# Patient Record
Sex: Female | Born: 1965
Health system: Southern US, Community
[De-identification: ages and names within clinical notes are randomized; demographics above are authoritative.]

---

## 2001-11-11 ENCOUNTER — Encounter: Payer: Self-pay | Admitting: *Deleted

## 2001-11-11 ENCOUNTER — Encounter: Admission: RE | Admit: 2001-11-11 | Discharge: 2001-11-11 | Payer: Self-pay | Admitting: *Deleted

## 2001-12-05 ENCOUNTER — Encounter: Payer: Self-pay | Admitting: Family Medicine

## 2001-12-05 ENCOUNTER — Encounter: Admission: RE | Admit: 2001-12-05 | Discharge: 2001-12-05 | Payer: Self-pay | Admitting: Family Medicine

## 2001-12-14 ENCOUNTER — Encounter: Payer: Self-pay | Admitting: General Surgery

## 2001-12-14 ENCOUNTER — Observation Stay (HOSPITAL_COMMUNITY): Admission: RE | Admit: 2001-12-14 | Discharge: 2001-12-15 | Payer: Self-pay | Admitting: General Surgery

## 2004-05-24 ENCOUNTER — Other Ambulatory Visit: Admission: RE | Admit: 2004-05-24 | Discharge: 2004-05-24 | Payer: Self-pay | Admitting: Family Medicine

## 2004-12-28 ENCOUNTER — Encounter: Admission: RE | Admit: 2004-12-28 | Discharge: 2004-12-28 | Payer: Self-pay | Admitting: Family Medicine

## 2005-07-13 ENCOUNTER — Other Ambulatory Visit: Admission: RE | Admit: 2005-07-13 | Discharge: 2005-07-13 | Payer: Self-pay | Admitting: Family Medicine

## 2010-09-05 ENCOUNTER — Other Ambulatory Visit: Payer: Self-pay | Admitting: Obstetrics and Gynecology

## 2010-09-05 ENCOUNTER — Other Ambulatory Visit (HOSPITAL_COMMUNITY)
Admission: RE | Admit: 2010-09-05 | Discharge: 2010-09-05 | Disposition: A | Discharge status: Home / Self Care | Payer: PRIVATE HEALTH INSURANCE | Source: Ambulatory Visit | Attending: Obstetrics and Gynecology | Admitting: Obstetrics and Gynecology

## 2010-09-05 DIAGNOSIS — Z01419 Encounter for gynecological examination (general) (routine) without abnormal findings: Secondary | ICD-10-CM | POA: Insufficient documentation

## 2011-02-21 ENCOUNTER — Other Ambulatory Visit: Payer: Self-pay | Admitting: Physician Assistant

## 2011-02-21 ENCOUNTER — Ambulatory Visit
Admission: RE | Admit: 2011-02-21 | Discharge: 2011-02-21 | Disposition: A | Discharge status: Home / Self Care | Payer: PRIVATE HEALTH INSURANCE | Source: Ambulatory Visit | Attending: Physician Assistant | Admitting: Physician Assistant

## 2011-02-21 DIAGNOSIS — M549 Dorsalgia, unspecified: Secondary | ICD-10-CM

## 2011-09-05 ENCOUNTER — Other Ambulatory Visit (HOSPITAL_COMMUNITY)
Admission: RE | Admit: 2011-09-05 | Discharge: 2011-09-05 | Disposition: A | Discharge status: Home / Self Care | Payer: PRIVATE HEALTH INSURANCE | Source: Ambulatory Visit | Attending: Obstetrics and Gynecology | Admitting: Obstetrics and Gynecology

## 2011-09-05 ENCOUNTER — Other Ambulatory Visit: Payer: Self-pay | Admitting: Obstetrics and Gynecology

## 2011-09-05 DIAGNOSIS — Z01419 Encounter for gynecological examination (general) (routine) without abnormal findings: Secondary | ICD-10-CM | POA: Insufficient documentation

## 2012-09-04 ENCOUNTER — Other Ambulatory Visit: Payer: Self-pay | Admitting: Obstetrics and Gynecology

## 2012-09-04 ENCOUNTER — Other Ambulatory Visit (HOSPITAL_COMMUNITY)
Admission: RE | Admit: 2012-09-04 | Discharge: 2012-09-04 | Disposition: A | Discharge status: Home / Self Care | Payer: BC Managed Care – PPO | Source: Ambulatory Visit | Attending: Obstetrics and Gynecology | Admitting: Obstetrics and Gynecology

## 2012-09-04 DIAGNOSIS — Z01419 Encounter for gynecological examination (general) (routine) without abnormal findings: Secondary | ICD-10-CM | POA: Insufficient documentation

## 2012-09-04 DIAGNOSIS — Z1151 Encounter for screening for human papillomavirus (HPV): Secondary | ICD-10-CM | POA: Insufficient documentation

## 2013-09-08 ENCOUNTER — Other Ambulatory Visit: Payer: Self-pay | Admitting: Obstetrics and Gynecology

## 2013-09-08 ENCOUNTER — Other Ambulatory Visit (HOSPITAL_COMMUNITY)
Admission: RE | Admit: 2013-09-08 | Discharge: 2013-09-08 | Disposition: A | Discharge status: Home / Self Care | Payer: BC Managed Care – PPO | Source: Ambulatory Visit | Attending: Obstetrics and Gynecology | Admitting: Obstetrics and Gynecology

## 2013-09-08 DIAGNOSIS — Z01419 Encounter for gynecological examination (general) (routine) without abnormal findings: Secondary | ICD-10-CM | POA: Insufficient documentation

## 2014-09-07 ENCOUNTER — Other Ambulatory Visit: Payer: Self-pay | Admitting: Obstetrics and Gynecology

## 2014-09-07 ENCOUNTER — Other Ambulatory Visit (HOSPITAL_COMMUNITY)
Admission: RE | Admit: 2014-09-07 | Discharge: 2014-09-07 | Disposition: A | Discharge status: Home / Self Care | Payer: BLUE CROSS/BLUE SHIELD | Source: Ambulatory Visit | Attending: Obstetrics and Gynecology | Admitting: Obstetrics and Gynecology

## 2014-09-07 DIAGNOSIS — Z01419 Encounter for gynecological examination (general) (routine) without abnormal findings: Secondary | ICD-10-CM | POA: Insufficient documentation

## 2014-09-08 LAB — CYTOLOGY - PAP

## 2015-06-22 ENCOUNTER — Other Ambulatory Visit: Payer: Self-pay | Admitting: Gastroenterology

## 2015-09-07 ENCOUNTER — Other Ambulatory Visit: Payer: Self-pay | Admitting: Obstetrics and Gynecology

## 2015-09-07 ENCOUNTER — Other Ambulatory Visit (HOSPITAL_COMMUNITY)
Admission: RE | Admit: 2015-09-07 | Discharge: 2015-09-07 | Disposition: A | Discharge status: Home / Self Care | Payer: BLUE CROSS/BLUE SHIELD | Source: Ambulatory Visit | Attending: Obstetrics and Gynecology | Admitting: Obstetrics and Gynecology

## 2015-09-07 DIAGNOSIS — Z1151 Encounter for screening for human papillomavirus (HPV): Secondary | ICD-10-CM | POA: Insufficient documentation

## 2015-09-07 DIAGNOSIS — Z01419 Encounter for gynecological examination (general) (routine) without abnormal findings: Secondary | ICD-10-CM | POA: Insufficient documentation

## 2015-09-09 LAB — CYTOLOGY - PAP

## 2015-10-21 DIAGNOSIS — F339 Major depressive disorder, recurrent, unspecified: Secondary | ICD-10-CM | POA: Diagnosis not present

## 2015-10-21 DIAGNOSIS — G479 Sleep disorder, unspecified: Secondary | ICD-10-CM | POA: Diagnosis not present

## 2016-03-07 DIAGNOSIS — D225 Melanocytic nevi of trunk: Secondary | ICD-10-CM | POA: Diagnosis not present

## 2016-03-07 DIAGNOSIS — D1800 Hemangioma unspecified site: Secondary | ICD-10-CM | POA: Diagnosis not present

## 2016-03-07 DIAGNOSIS — Z808 Family history of malignant neoplasm of other organs or systems: Secondary | ICD-10-CM | POA: Diagnosis not present

## 2016-03-07 DIAGNOSIS — L814 Other melanin hyperpigmentation: Secondary | ICD-10-CM | POA: Diagnosis not present

## 2016-04-28 DIAGNOSIS — G479 Sleep disorder, unspecified: Secondary | ICD-10-CM | POA: Diagnosis not present

## 2016-04-28 DIAGNOSIS — F339 Major depressive disorder, recurrent, unspecified: Secondary | ICD-10-CM | POA: Diagnosis not present

## 2016-09-28 DIAGNOSIS — G4733 Obstructive sleep apnea (adult) (pediatric): Secondary | ICD-10-CM | POA: Diagnosis not present

## 2016-10-24 DIAGNOSIS — Z01419 Encounter for gynecological examination (general) (routine) without abnormal findings: Secondary | ICD-10-CM | POA: Diagnosis not present

## 2016-11-15 DIAGNOSIS — M9903 Segmental and somatic dysfunction of lumbar region: Secondary | ICD-10-CM | POA: Diagnosis not present

## 2016-11-15 DIAGNOSIS — M5442 Lumbago with sciatica, left side: Secondary | ICD-10-CM | POA: Diagnosis not present

## 2016-11-15 DIAGNOSIS — M5106 Intervertebral disc disorders with myelopathy, lumbar region: Secondary | ICD-10-CM | POA: Diagnosis not present

## 2016-11-15 DIAGNOSIS — Z0000 Encounter for general adult medical examination without abnormal findings: Secondary | ICD-10-CM | POA: Diagnosis not present

## 2016-11-15 DIAGNOSIS — F339 Major depressive disorder, recurrent, unspecified: Secondary | ICD-10-CM | POA: Diagnosis not present

## 2016-11-15 DIAGNOSIS — M545 Low back pain: Secondary | ICD-10-CM | POA: Diagnosis not present

## 2016-11-15 DIAGNOSIS — G479 Sleep disorder, unspecified: Secondary | ICD-10-CM | POA: Diagnosis not present

## 2016-11-20 DIAGNOSIS — M5442 Lumbago with sciatica, left side: Secondary | ICD-10-CM | POA: Diagnosis not present

## 2016-11-20 DIAGNOSIS — M9903 Segmental and somatic dysfunction of lumbar region: Secondary | ICD-10-CM | POA: Diagnosis not present

## 2016-11-20 DIAGNOSIS — M5106 Intervertebral disc disorders with myelopathy, lumbar region: Secondary | ICD-10-CM | POA: Diagnosis not present

## 2016-11-29 DIAGNOSIS — M5442 Lumbago with sciatica, left side: Secondary | ICD-10-CM | POA: Diagnosis not present

## 2016-11-29 DIAGNOSIS — M5106 Intervertebral disc disorders with myelopathy, lumbar region: Secondary | ICD-10-CM | POA: Diagnosis not present

## 2016-11-29 DIAGNOSIS — M9903 Segmental and somatic dysfunction of lumbar region: Secondary | ICD-10-CM | POA: Diagnosis not present

## 2016-12-26 DIAGNOSIS — H01022 Squamous blepharitis right lower eyelid: Secondary | ICD-10-CM | POA: Diagnosis not present

## 2016-12-26 DIAGNOSIS — H01024 Squamous blepharitis left upper eyelid: Secondary | ICD-10-CM | POA: Diagnosis not present

## 2016-12-26 DIAGNOSIS — H01021 Squamous blepharitis right upper eyelid: Secondary | ICD-10-CM | POA: Diagnosis not present

## 2016-12-26 DIAGNOSIS — H01025 Squamous blepharitis left lower eyelid: Secondary | ICD-10-CM | POA: Diagnosis not present

## 2017-03-07 DIAGNOSIS — Z808 Family history of malignant neoplasm of other organs or systems: Secondary | ICD-10-CM | POA: Diagnosis not present

## 2017-03-07 DIAGNOSIS — D225 Melanocytic nevi of trunk: Secondary | ICD-10-CM | POA: Diagnosis not present

## 2017-03-07 DIAGNOSIS — L814 Other melanin hyperpigmentation: Secondary | ICD-10-CM | POA: Diagnosis not present

## 2017-03-07 DIAGNOSIS — D1800 Hemangioma unspecified site: Secondary | ICD-10-CM | POA: Diagnosis not present

## 2017-03-08 DIAGNOSIS — N906 Unspecified hypertrophy of vulva: Secondary | ICD-10-CM | POA: Diagnosis not present

## 2017-03-08 DIAGNOSIS — N898 Other specified noninflammatory disorders of vagina: Secondary | ICD-10-CM | POA: Diagnosis not present

## 2017-03-08 DIAGNOSIS — N907 Vulvar cyst: Secondary | ICD-10-CM | POA: Diagnosis not present

## 2017-05-18 DIAGNOSIS — G479 Sleep disorder, unspecified: Secondary | ICD-10-CM | POA: Diagnosis not present

## 2017-05-18 DIAGNOSIS — F339 Major depressive disorder, recurrent, unspecified: Secondary | ICD-10-CM | POA: Diagnosis not present

## 2017-07-26 ENCOUNTER — Other Ambulatory Visit: Payer: Self-pay | Admitting: Chiropractor

## 2017-07-26 DIAGNOSIS — M5442 Lumbago with sciatica, left side: Secondary | ICD-10-CM

## 2017-07-30 ENCOUNTER — Ambulatory Visit
Admission: RE | Admit: 2017-07-30 | Discharge: 2017-07-30 | Disposition: A | Discharge status: Home / Self Care | Payer: BLUE CROSS/BLUE SHIELD | Source: Ambulatory Visit | Attending: Chiropractor | Admitting: Chiropractor

## 2017-07-30 DIAGNOSIS — M48061 Spinal stenosis, lumbar region without neurogenic claudication: Secondary | ICD-10-CM | POA: Diagnosis not present

## 2017-07-30 DIAGNOSIS — M5442 Lumbago with sciatica, left side: Secondary | ICD-10-CM

## 2017-08-07 DIAGNOSIS — M545 Low back pain: Secondary | ICD-10-CM | POA: Diagnosis not present

## 2017-08-08 DIAGNOSIS — M5126 Other intervertebral disc displacement, lumbar region: Secondary | ICD-10-CM | POA: Diagnosis not present

## 2017-08-08 DIAGNOSIS — M5416 Radiculopathy, lumbar region: Secondary | ICD-10-CM | POA: Diagnosis not present

## 2017-08-13 DIAGNOSIS — M5416 Radiculopathy, lumbar region: Secondary | ICD-10-CM | POA: Diagnosis not present

## 2017-08-13 DIAGNOSIS — M5126 Other intervertebral disc displacement, lumbar region: Secondary | ICD-10-CM | POA: Diagnosis not present

## 2017-08-16 DIAGNOSIS — M5416 Radiculopathy, lumbar region: Secondary | ICD-10-CM | POA: Diagnosis not present

## 2017-08-16 DIAGNOSIS — M5126 Other intervertebral disc displacement, lumbar region: Secondary | ICD-10-CM | POA: Diagnosis not present

## 2017-08-21 DIAGNOSIS — M5416 Radiculopathy, lumbar region: Secondary | ICD-10-CM | POA: Diagnosis not present

## 2017-08-21 DIAGNOSIS — M5126 Other intervertebral disc displacement, lumbar region: Secondary | ICD-10-CM | POA: Diagnosis not present

## 2017-08-23 DIAGNOSIS — M5416 Radiculopathy, lumbar region: Secondary | ICD-10-CM | POA: Diagnosis not present

## 2017-08-23 DIAGNOSIS — M5126 Other intervertebral disc displacement, lumbar region: Secondary | ICD-10-CM | POA: Diagnosis not present

## 2017-08-28 DIAGNOSIS — M5416 Radiculopathy, lumbar region: Secondary | ICD-10-CM | POA: Diagnosis not present

## 2017-08-28 DIAGNOSIS — M5126 Other intervertebral disc displacement, lumbar region: Secondary | ICD-10-CM | POA: Diagnosis not present

## 2017-08-29 DIAGNOSIS — M545 Low back pain: Secondary | ICD-10-CM | POA: Diagnosis not present

## 2017-09-03 DIAGNOSIS — M5416 Radiculopathy, lumbar region: Secondary | ICD-10-CM | POA: Diagnosis not present

## 2017-09-11 DIAGNOSIS — M5416 Radiculopathy, lumbar region: Secondary | ICD-10-CM | POA: Diagnosis not present

## 2017-09-21 DIAGNOSIS — M5416 Radiculopathy, lumbar region: Secondary | ICD-10-CM | POA: Diagnosis not present

## 2017-09-28 ENCOUNTER — Other Ambulatory Visit: Payer: Self-pay | Admitting: Physical Medicine and Rehabilitation

## 2017-09-28 DIAGNOSIS — M545 Low back pain: Principal | ICD-10-CM

## 2017-09-28 DIAGNOSIS — G8929 Other chronic pain: Secondary | ICD-10-CM

## 2017-10-08 ENCOUNTER — Ambulatory Visit
Admission: RE | Admit: 2017-10-08 | Discharge: 2017-10-08 | Disposition: A | Discharge status: Home / Self Care | Payer: BLUE CROSS/BLUE SHIELD | Source: Ambulatory Visit | Attending: Physical Medicine and Rehabilitation | Admitting: Physical Medicine and Rehabilitation

## 2017-10-08 DIAGNOSIS — G8929 Other chronic pain: Secondary | ICD-10-CM

## 2017-10-08 DIAGNOSIS — M545 Low back pain: Principal | ICD-10-CM

## 2017-10-08 DIAGNOSIS — M4727 Other spondylosis with radiculopathy, lumbosacral region: Secondary | ICD-10-CM | POA: Diagnosis not present

## 2017-10-08 MED ORDER — METHYLPREDNISOLONE ACETATE 40 MG/ML INJ SUSP (RADIOLOG
120.0000 mg | Freq: Once | INTRAMUSCULAR | Status: AC
  Administered 2017-10-08: 120 mg via EPIDURAL

## 2017-10-08 MED ORDER — IOPAMIDOL (ISOVUE-M 200) INJECTION 41%
1.0000 mL | Freq: Once | INTRAMUSCULAR | Status: AC
  Administered 2017-10-08: 1 mL via EPIDURAL

## 2017-10-08 NOTE — Discharge Instructions (Signed)

## 2017-10-26 DIAGNOSIS — Z01419 Encounter for gynecological examination (general) (routine) without abnormal findings: Secondary | ICD-10-CM | POA: Diagnosis not present

## 2017-10-30 DIAGNOSIS — M5416 Radiculopathy, lumbar region: Secondary | ICD-10-CM | POA: Diagnosis not present

## 2017-11-07 DIAGNOSIS — M5416 Radiculopathy, lumbar region: Secondary | ICD-10-CM | POA: Diagnosis not present

## 2017-11-07 DIAGNOSIS — M5126 Other intervertebral disc displacement, lumbar region: Secondary | ICD-10-CM | POA: Diagnosis not present

## 2017-11-13 DIAGNOSIS — M5126 Other intervertebral disc displacement, lumbar region: Secondary | ICD-10-CM | POA: Diagnosis not present

## 2017-11-13 DIAGNOSIS — M5416 Radiculopathy, lumbar region: Secondary | ICD-10-CM | POA: Diagnosis not present

## 2017-11-15 DIAGNOSIS — M5416 Radiculopathy, lumbar region: Secondary | ICD-10-CM | POA: Diagnosis not present

## 2017-11-15 DIAGNOSIS — M5126 Other intervertebral disc displacement, lumbar region: Secondary | ICD-10-CM | POA: Diagnosis not present

## 2017-11-19 DIAGNOSIS — G479 Sleep disorder, unspecified: Secondary | ICD-10-CM | POA: Diagnosis not present

## 2017-11-19 DIAGNOSIS — Z0000 Encounter for general adult medical examination without abnormal findings: Secondary | ICD-10-CM | POA: Diagnosis not present

## 2017-11-19 DIAGNOSIS — Z13220 Encounter for screening for lipoid disorders: Secondary | ICD-10-CM | POA: Diagnosis not present

## 2017-11-19 DIAGNOSIS — F339 Major depressive disorder, recurrent, unspecified: Secondary | ICD-10-CM | POA: Diagnosis not present

## 2017-11-21 DIAGNOSIS — M5126 Other intervertebral disc displacement, lumbar region: Secondary | ICD-10-CM | POA: Diagnosis not present

## 2017-11-21 DIAGNOSIS — M5416 Radiculopathy, lumbar region: Secondary | ICD-10-CM | POA: Diagnosis not present

## 2017-11-28 DIAGNOSIS — M5416 Radiculopathy, lumbar region: Secondary | ICD-10-CM | POA: Diagnosis not present

## 2017-11-28 DIAGNOSIS — M5126 Other intervertebral disc displacement, lumbar region: Secondary | ICD-10-CM | POA: Diagnosis not present

## 2017-12-12 DIAGNOSIS — M5126 Other intervertebral disc displacement, lumbar region: Secondary | ICD-10-CM | POA: Diagnosis not present

## 2017-12-12 DIAGNOSIS — M5416 Radiculopathy, lumbar region: Secondary | ICD-10-CM | POA: Diagnosis not present

## 2018-01-09 DIAGNOSIS — M5126 Other intervertebral disc displacement, lumbar region: Secondary | ICD-10-CM | POA: Diagnosis not present

## 2018-01-09 DIAGNOSIS — M5416 Radiculopathy, lumbar region: Secondary | ICD-10-CM | POA: Diagnosis not present

## 2018-01-25 DIAGNOSIS — H2513 Age-related nuclear cataract, bilateral: Secondary | ICD-10-CM | POA: Diagnosis not present

## 2018-03-12 DIAGNOSIS — D1800 Hemangioma unspecified site: Secondary | ICD-10-CM | POA: Diagnosis not present

## 2018-03-12 DIAGNOSIS — Z808 Family history of malignant neoplasm of other organs or systems: Secondary | ICD-10-CM | POA: Diagnosis not present

## 2018-03-12 DIAGNOSIS — D225 Melanocytic nevi of trunk: Secondary | ICD-10-CM | POA: Diagnosis not present

## 2018-03-12 DIAGNOSIS — L814 Other melanin hyperpigmentation: Secondary | ICD-10-CM | POA: Diagnosis not present

## 2018-04-24 DIAGNOSIS — Z1231 Encounter for screening mammogram for malignant neoplasm of breast: Secondary | ICD-10-CM | POA: Diagnosis not present

## 2018-05-21 DIAGNOSIS — G479 Sleep disorder, unspecified: Secondary | ICD-10-CM | POA: Diagnosis not present

## 2018-05-21 DIAGNOSIS — Z20828 Contact with and (suspected) exposure to other viral communicable diseases: Secondary | ICD-10-CM | POA: Diagnosis not present

## 2018-05-21 DIAGNOSIS — F339 Major depressive disorder, recurrent, unspecified: Secondary | ICD-10-CM | POA: Diagnosis not present

## 2018-11-27 DIAGNOSIS — Z0000 Encounter for general adult medical examination without abnormal findings: Secondary | ICD-10-CM | POA: Diagnosis not present

## 2018-11-27 DIAGNOSIS — G479 Sleep disorder, unspecified: Secondary | ICD-10-CM | POA: Diagnosis not present

## 2018-11-27 DIAGNOSIS — F339 Major depressive disorder, recurrent, unspecified: Secondary | ICD-10-CM | POA: Diagnosis not present

## 2018-11-29 DIAGNOSIS — Z0000 Encounter for general adult medical examination without abnormal findings: Secondary | ICD-10-CM | POA: Diagnosis not present

## 2018-11-29 DIAGNOSIS — Z13220 Encounter for screening for lipoid disorders: Secondary | ICD-10-CM | POA: Diagnosis not present

## 2018-11-29 DIAGNOSIS — Z8249 Family history of ischemic heart disease and other diseases of the circulatory system: Secondary | ICD-10-CM | POA: Diagnosis not present

## 2019-01-21 ENCOUNTER — Other Ambulatory Visit: Payer: Self-pay | Admitting: Obstetrics and Gynecology

## 2019-01-21 ENCOUNTER — Other Ambulatory Visit (HOSPITAL_COMMUNITY)
Admission: RE | Admit: 2019-01-21 | Discharge: 2019-01-21 | Disposition: A | Discharge status: Home / Self Care | Payer: BC Managed Care – PPO | Source: Ambulatory Visit | Attending: Obstetrics and Gynecology | Admitting: Obstetrics and Gynecology

## 2019-01-21 DIAGNOSIS — Z01419 Encounter for gynecological examination (general) (routine) without abnormal findings: Secondary | ICD-10-CM | POA: Insufficient documentation

## 2019-01-21 DIAGNOSIS — Z113 Encounter for screening for infections with a predominantly sexual mode of transmission: Secondary | ICD-10-CM | POA: Diagnosis not present

## 2019-01-22 LAB — CYTOLOGY - PAP
Chlamydia: NEGATIVE
Diagnosis: NEGATIVE
HPV: NOT DETECTED
Neisseria Gonorrhea: NEGATIVE

## 2019-01-30 DIAGNOSIS — H52223 Regular astigmatism, bilateral: Secondary | ICD-10-CM | POA: Diagnosis not present

## 2019-01-30 DIAGNOSIS — H0288A Meibomian gland dysfunction right eye, upper and lower eyelids: Secondary | ICD-10-CM | POA: Diagnosis not present

## 2019-01-30 DIAGNOSIS — H2513 Age-related nuclear cataract, bilateral: Secondary | ICD-10-CM | POA: Diagnosis not present

## 2019-01-30 DIAGNOSIS — H0102A Squamous blepharitis right eye, upper and lower eyelids: Secondary | ICD-10-CM | POA: Diagnosis not present

## 2019-01-30 DIAGNOSIS — H524 Presbyopia: Secondary | ICD-10-CM | POA: Diagnosis not present

## 2019-01-30 DIAGNOSIS — H0102B Squamous blepharitis left eye, upper and lower eyelids: Secondary | ICD-10-CM | POA: Diagnosis not present

## 2019-01-30 DIAGNOSIS — H5213 Myopia, bilateral: Secondary | ICD-10-CM | POA: Diagnosis not present

## 2019-04-22 ENCOUNTER — Ambulatory Visit: Payer: Self-pay | Admitting: Medical

## 2019-04-23 ENCOUNTER — Telehealth: Payer: Self-pay | Admitting: Medical

## 2019-04-23 DIAGNOSIS — M5126 Other intervertebral disc displacement, lumbar region: Secondary | ICD-10-CM | POA: Insufficient documentation

## 2019-04-23 DIAGNOSIS — F32A Depression, unspecified: Secondary | ICD-10-CM | POA: Insufficient documentation

## 2019-04-23 DIAGNOSIS — H6500 Acute serous otitis media, unspecified ear: Secondary | ICD-10-CM

## 2019-04-23 DIAGNOSIS — F329 Major depressive disorder, single episode, unspecified: Secondary | ICD-10-CM | POA: Insufficient documentation

## 2019-04-23 MED ORDER — AMOXICILLIN-POT CLAVULANATE 875-125 MG PO TABS: 1.0000 | ORAL_TABLET | Freq: Two times a day (BID) | ORAL | 0 refills | Status: DC

## 2019-04-23 NOTE — Progress Notes (Signed)
Permission for appointment  by telemedicine.  53 yo female in non acute distress. Calls in complaining of  Right earache starting a couple of weeks ago. She used rubbing alcohol and it seemed to improve. Return  2 days ago( took  2  Advil)  , had an appointment yesterday, cancelled because it seemed better.  Then it returned today, she used rubbing alcohol again and then white vinegar. She took nothing for pain. It is painful when she pushes on the tragus. No pain with pulling on the pinnae. "it feels like it may be due to an overproduction of wax". She does use q-tips in the canal. She denies fever or chills , no ST ( mild yesterday along with fatigue which also has improved), denies cough  or  CP. She states she did have mild congestion when she woke up this morning but it has now resolved.   No Known Allergies   Current Outpatient Medications:  .  amoxicillin-clavulanate (AUGMENTIN) 875-125 MG tablet, Take 1 tablet by mouth 2 (two) times daily. Take with food, Disp: 20 tablet, Rfl: 0 .  citalopram (CELEXA) 40 MG tablet, Take 40 mg by mouth daily., Disp: , Rfl:  .  clonazePAM (KLONOPIN) 0.5 MG tablet, TAKE 1/2  1 TABLET BY MOUTH ONCE DAILY AS NEEDED, Disp: , Rfl:  .  gabapentin (NEURONTIN) 300 MG capsule, TAKE 1 CAPSULE BY MOUTH EVERYDAY AT BEDTIME, Disp: , Rfl:    PMHx. Anxiety and Depression Herniated disc    Current Outpatient Medications:  .  amoxicillin-clavulanate (AUGMENTIN) 875-125 MG tablet, Take 1 tablet by mouth 2 (two) times daily. Take with food, Disp: 20 tablet, Rfl: 0 .  citalopram (CELEXA) 40 MG tablet, Take 40 mg by mouth daily., Disp: , Rfl:  .  clonazePAM (KLONOPIN) 0.5 MG tablet, TAKE 1/2  1 TABLET BY MOUTH ONCE DAILY AS NEEDED, Disp: , Rfl:  .  gabapentin (NEURONTIN) 300 MG capsule, TAKE 1 CAPSULE BY MOUTH EVERYDAY AT BEDTIME, Disp: , Rfl:    Dx/Plan Will treat as Right  Otitis Media, non-recurrent Meds ordered this encounter  Medications  .  amoxicillin-clavulanate (AUGMENTIN) 875-125 MG tablet    Sig: Take 1 tablet by mouth 2 (two) times daily. Take with food    Dispense:  20 tablet    Refill:  0  Return for follow up in 3-5 days or see your primary doctor if not improving. Patient verbalizes understanding and has no questions at discharge.  Could not close note due to patient not being checked in. Once checked in by Belinda I was able to finish the note.

## 2019-04-24 ENCOUNTER — Encounter: Payer: Self-pay | Admitting: Nurse Practitioner

## 2019-04-24 ENCOUNTER — Ambulatory Visit: Payer: Self-pay | Admitting: Nurse Practitioner

## 2019-04-24 ENCOUNTER — Other Ambulatory Visit: Payer: Self-pay

## 2019-04-24 VITALS — BP 115/79 | HR 63 | Temp 97.8°F | Resp 18 | Ht 64.0 in | Wt 153.0 lb

## 2019-04-24 DIAGNOSIS — H6121 Impacted cerumen, right ear: Secondary | ICD-10-CM

## 2019-04-24 DIAGNOSIS — J0100 Acute maxillary sinusitis, unspecified: Secondary | ICD-10-CM

## 2019-04-24 NOTE — Patient Instructions (Addendum)
Jasmine Burke please consider getting over the counter debrox drops and return next week after 3-5 days for right ear irrigation Get your antibiotics my colleague sent to the pharmacy yesterday for your sinus infection and take as directed; if any issues please call the clinic Start over the counter Allegra(Fexofenadine) as directed and resume the netty pot Encouraged patient to call the office or primary care doctor for an appointment if no improvement in symptoms or if symptoms change or worsen after 72 hours of planned treatment. Patient verbalized understanding of all instructions given/reviewed and has no further questions or concerns at this time.       Ear Drops, Adult Your doctor has found that you have a condition that requires you to use ear drops. Ear drops are a medicine that is placed in the ear. This sheet gives you information about how to use this medicine. Your doctor may also give you more specific instructions. Supplies needed:  Cotton ball.  Medicine. How to put ear drops into your ear   1. Wash your hands with soap and water. 2. Make sure your ears are clean and dry. 3. Warm the medicine by holding it in your hand for a few minutes. 4. Shake the medicine to mix the ear drops. 5. Use the tube to get the medicine. You will need to squeeze the round part of the tube to do this. 6. Put the drops in your ear as told. Hold the tube above your ear. Do not let the tube touch your ear. The medicine may go in easier if you pull the flap of your ear up and back while you put the drops in. 7. To make sure your ear soaks up the medicine, do one of these things: ? Lie down for 10 minutes. The ear with the medicine should face up. ? Put a cotton ball in your ear. Do not push it deeper into your ear. Take out the cotton ball when the drops have been soaked up. 8. If you need to put drops in your other ear, repeat the same steps. Your doctor will tell you if you should put drops in both  ears. Follow these instructions at home:  Use the ear drops for as long as your doctor tells you to. Do not stop even if your symptoms get better.  Keep the ear drops at room temperature.  Keep all follow-up visits as told by your doctor. This is important. Contact a doctor if:  Your condition gets worse.  Your pain gets worse.  Unusual fluid (drainage) is coming from your ear, especially if the fluid stinks.  You have trouble hearing. Get help right away if:  You feel like the room is spinning and you feel sick to your stomach. This condition is called vertigo.  The outside of your ear becomes red or swollen.  You have a very bad headache. Summary  Ear drops are a medicine that is put in the ear.  Put the drops in your ear as told by your doctor.  Use the ear drops for as long as your doctor tells you to. Do not stop even if your symptoms get better. This information is not intended to replace advice given to you by your health care provider. Make sure you discuss any questions you have with your health care provider. Document Released: 12/07/2009 Document Revised: 06/01/2017 Document Reviewed: 06/23/2016 Elsevier Patient Education  [place].  Sinusitis, Adult  Sinusitis is inflammation of your sinuses. Sinuses are  hollow spaces in the bones around your face. Your sinuses are located:  Around your eyes.  In the middle of your forehead.  Behind your nose.  In your cheekbones. Mucus normally drains out of your sinuses. When your nasal tissues become inflamed or swollen, mucus can become trapped or blocked. This allows bacteria, viruses, and fungi to grow, which leads to infection. Most infections of the sinuses are caused by a virus. Sinusitis can develop quickly. It can last for up to 4 weeks (acute) or for more than 12 weeks (chronic). Sinusitis often develops after a cold. What are the causes? This condition is caused by anything that creates swelling in  the sinuses or stops mucus from draining. This includes:  Allergies.  Asthma.  Infection from bacteria or viruses.  Deformities or blockages in your nose or sinuses.  Abnormal growths in the nose (nasal polyps).  Pollutants, such as chemicals or irritants in the air.  Infection from fungi (rare). What increases the risk? You are more likely to develop this condition if you:  Have a weak body defense system (immune system).  Do a lot of swimming or diving.  Overuse nasal sprays.  Smoke. What are the signs or symptoms? The main symptoms of this condition are pain and a feeling of pressure around the affected sinuses. Other symptoms include:  Stuffy nose or congestion.  Thick drainage from your nose.  Swelling and warmth over the affected sinuses.  Headache.  Upper toothache.  A cough that may get worse at night.  Extra mucus that collects in the throat or the back of the nose (postnasal drip).  Decreased sense of smell and taste.  Fatigue.  A fever.  Sore throat.  Bad breath. How is this diagnosed? This condition is diagnosed based on:  Your symptoms.  Your medical history.  A physical exam.  Tests to find out if your condition is acute or chronic. This may include: ? Checking your nose for nasal polyps. ? Viewing your sinuses using a device that has a light (endoscope). ? Testing for allergies or bacteria. ? Imaging tests, such as an MRI or CT scan. In rare cases, a bone biopsy may be done to rule out more serious types of fungal sinus disease. How is this treated? Treatment for sinusitis depends on the cause and whether your condition is chronic or acute.  If caused by a virus, your symptoms should go away on their own within 10 days. You may be given medicines to relieve symptoms. They include: ? Medicines that shrink swollen nasal passages (topical intranasal decongestants). ? Medicines that treat allergies (antihistamines). ? A spray that  eases inflammation of the nostrils (topical intranasal corticosteroids). ? Rinses that help get rid of thick mucus in your nose (nasal saline washes).  If caused by bacteria, your health care provider may recommend waiting to see if your symptoms improve. Most bacterial infections will get better without antibiotic medicine. You may be given antibiotics if you have: ? A severe infection. ? A weak immune system.  If caused by narrow nasal passages or nasal polyps, you may need to have surgery. Follow these instructions at home: Medicines  Take, use, or apply over-the-counter and prescription medicines only as told by your health care provider. These may include nasal sprays.  If you were prescribed an antibiotic medicine, take it as told by your health care provider. Do not stop taking the antibiotic even if you start to feel better. Hydrate and humidify  Drink enough fluid to keep your urine pale yellow. Staying hydrated will help to thin your mucus.  Use a cool mist humidifier to keep the humidity level in your home above 50%.  Inhale steam for 10-15 minutes, 3-4 times a day, or as told by your health care provider. You can do this in the bathroom while a hot shower is running.  Limit your exposure to cool or dry air. Rest  Rest as much as possible.  Sleep with your head raised (elevated).  Make sure you get enough sleep each night. General instructions    Apply a warm, moist washcloth to your face 3-4 times a day or as told by your health care provider. This will help with discomfort.  Wash your hands often with soap and water to reduce your exposure to germs. If soap and water are not available, use hand sanitizer.  Do not smoke. Avoid being around people who are smoking (secondhand smoke).  Keep all follow-up visits as told by your health care provider. This is important. Contact a health care provider if:  You have a fever.  Your symptoms get worse.  Your  symptoms do not improve within 10 days. Get help right away if:  You have a severe headache.  You have persistent vomiting.  You have severe pain or swelling around your face or eyes.  You have vision problems.  You develop confusion.  Your neck is stiff.  You have trouble breathing. Summary  Sinusitis is soreness and inflammation of your sinuses. Sinuses are hollow spaces in the bones around your face.  This condition is caused by nasal tissues that become inflamed or swollen. The swelling traps or blocks the flow of mucus. This allows bacteria, viruses, and fungi to grow, which leads to infection.  If you were prescribed an antibiotic medicine, take it as told by your health care provider. Do not stop taking the antibiotic even if you start to feel better.  Keep all follow-up visits as told by your health care provider. This is important. This information is not intended to replace advice given to you by your health care provider. Make sure you discuss any questions you have with your health care provider. Document Released: 06/19/2005 Document Revised: 11/19/2017 Document Reviewed: 11/19/2017 Elsevier Patient Education  2020 [employer].

## 2019-04-24 NOTE — Progress Notes (Signed)
   Subjective:    Patient ID: Jasmine Burke, female    DOB: 07-06-1965, 53 y.o.   MRN: [id number]  HPI Nakai is here today with for f/u as she has a telemedicine visit today and reports she didn't get a text about her Rx yesterday. She reports intermittent right ear pain and has moved to the left ear. She admits she has tried vinegar and alcohol with no relief. She denies any other symptoms such as fever, sore throat, chest pain, SOB.  She admits she has some fatigue but this isn't new as she has some stressors and takes psych meds as directed with relief.  Review of Systems  Constitutional: Positive for fatigue. Negative for fever.  HENT: Positive for ear pain.   Respiratory: Negative for cough and shortness of breath.   Cardiovascular: Negative for chest pain.       Objective:   Physical Exam Constitutional:      Appearance: Normal appearance.  HENT:     Head: Normocephalic and atraumatic.     Comments: Maxillary sinus tenderness    Right Ear: There is impacted cerumen.     Ears:     Comments: Left TM with clear fluid behind intact TM    Mouth/Throat:     Mouth: Mucous membranes are moist.  Neck:     Musculoskeletal: Normal range of motion and neck supple.     Comments: Bilateral upper anterior cervical lymphadenopathy with tenderness Cardiovascular:     Rate and Rhythm: Normal rate.  Pulmonary:     Effort: Pulmonary effort is normal.  Abdominal:     General: Abdomen is flat.     Palpations: Abdomen is soft.  Lymphadenopathy:     Cervical: Cervical adenopathy present.  Skin:    General: Skin is warm and dry.  Neurological:     Mental Status: She is alert.  Psychiatric:        Mood and Affect: Mood normal.           Assessment & Plan:  Lavage and currette attempted with no success. To get OTC debrox gtts and RTC for re-eval.

## 2019-04-29 ENCOUNTER — Other Ambulatory Visit: Payer: Self-pay

## 2019-04-30 ENCOUNTER — Ambulatory Visit: Payer: Self-pay

## 2019-05-01 ENCOUNTER — Telehealth: Payer: Self-pay

## 2019-05-01 NOTE — Telephone Encounter (Signed)
Patient was seen in the office a few weeks ago and was prescribed Amoxicillin.  Pt is now having diarrhea.  Instructed patient to go the pharmacy and pick up a probiotic. Also she is to eat yogurt (Activa) to assist also.  Pt verbalizes understanding.

## 2019-05-07 ENCOUNTER — Other Ambulatory Visit: Payer: Self-pay

## 2019-05-07 ENCOUNTER — Ambulatory Visit: Payer: Self-pay

## 2019-05-07 DIAGNOSIS — Z23 Encounter for immunization: Secondary | ICD-10-CM

## 2019-05-08 ENCOUNTER — Encounter: Payer: Self-pay | Admitting: Nurse Practitioner

## 2019-05-08 ENCOUNTER — Ambulatory Visit: Payer: Self-pay | Admitting: Nurse Practitioner

## 2019-05-08 ENCOUNTER — Other Ambulatory Visit: Payer: Self-pay

## 2019-05-08 VITALS — BP 119/74 | HR 65 | Resp 16 | Ht 64.0 in | Wt 154.0 lb

## 2019-05-08 DIAGNOSIS — H6121 Impacted cerumen, right ear: Secondary | ICD-10-CM

## 2019-05-08 DIAGNOSIS — J0100 Acute maxillary sinusitis, unspecified: Secondary | ICD-10-CM

## 2019-05-08 DIAGNOSIS — H6500 Acute serous otitis media, unspecified ear: Secondary | ICD-10-CM

## 2019-05-08 MED ORDER — DOXYCYCLINE HYCLATE 100 MG PO TABS: 100.0000 mg | ORAL_TABLET | Freq: Two times a day (BID) | ORAL | 0 refills | Status: DC

## 2019-05-08 NOTE — Patient Instructions (Addendum)
Cyndra please call your gyn today to get an appt. Continue Allegra and resume probiotic as directed Take all antibiotics as directed and if no improvement after you complete, please call me Get an over the counter debrox kit and use as directed. Put a little peroxide in the water to help break up the wax. If no improvement or symptoms worsen follow up with ENT Return to the clinic as needed

## 2019-05-08 NOTE — Progress Notes (Signed)
   Subjective:    Patient ID: Jasmine Burke, female    DOB: Jan 12, 1966, 53 y.o.   MRN: [id number]  HPI Jasmine Burke is here today to f/u on right ear pain/pressure. She has done the debrox drops as encouraged and is here today for evaluation of ear pain. Was advised to start abx another provider prescribed and she started to develop diarrhea after getting 5 days of treatment which she was encouraged to start probiotics and continue antibiotics which she did not continue recommended treatment. Today she's reports that she has facial pressure and pain and continued symptoms to right ear. Denies fever, sore throat, cough or SOB. Reports recent hemorrhoids and denies straining with a hx of this since child and they are in their 20's Jasmine Burke reports yesterday she felt lower vaginal pressure and "pushed" something back up which helped with the discomfort.   Review of Systems  Constitutional: Negative for fatigue and fever.  HENT: Positive for ear pain, hearing loss, sinus pressure and sinus pain.   Gastrointestinal:       Hemorrhoids  Genitourinary: Positive for vaginal pain.       Objective:   Physical Exam Vitals signs reviewed.  Constitutional:      Appearance: Normal appearance.  HENT:     Head: Normocephalic and atraumatic.     Comments: Maxillary sinus tenderness    Right Ear: There is impacted cerumen.     Left Ear: External ear normal.     Ears:     Comments: TM with no erythema and clear discharge behind intact TM. Unable to visualize right TM    Nose: Nose normal.  Neck:     Musculoskeletal: Normal range of motion and neck supple.  Cardiovascular:     Rate and Rhythm: Normal rate.     Pulses: Normal pulses.     Heart sounds: Normal heart sounds.  Pulmonary:     Effort: Pulmonary effort is normal.     Breath sounds: Normal breath sounds.  Abdominal:     General: Abdomen is flat. Bowel sounds are normal.     Palpations: Abdomen is soft.     Tenderness: There is no  abdominal tenderness. There is no guarding.  Genitourinary:    Comments: Deferred gyn exam: to f/u with gyn for possible uterine prolapse Lymphadenopathy:     Cervical: No cervical adenopathy.  Skin:    General: Skin is warm and dry.  Neurological:     Mental Status: She is alert.  Psychiatric:        Mood and Affect: Mood normal.           Assessment & Plan:  Able to remove some ear wax from the right via currette and reports improved hearing and relief. Didn't tolerate ear lavage and had to stop with c/o dizziness.

## 2019-05-09 DIAGNOSIS — N760 Acute vaginitis: Secondary | ICD-10-CM | POA: Diagnosis not present

## 2019-05-23 DIAGNOSIS — H9209 Otalgia, unspecified ear: Secondary | ICD-10-CM | POA: Diagnosis not present

## 2019-05-23 DIAGNOSIS — G479 Sleep disorder, unspecified: Secondary | ICD-10-CM | POA: Diagnosis not present

## 2019-05-23 DIAGNOSIS — F339 Major depressive disorder, recurrent, unspecified: Secondary | ICD-10-CM | POA: Diagnosis not present

## 2019-05-23 DIAGNOSIS — M25511 Pain in right shoulder: Secondary | ICD-10-CM | POA: Diagnosis not present

## 2019-09-05 DIAGNOSIS — Z23 Encounter for immunization: Secondary | ICD-10-CM | POA: Diagnosis not present

## 2019-10-08 DIAGNOSIS — Z23 Encounter for immunization: Secondary | ICD-10-CM | POA: Diagnosis not present

## 2019-12-02 DIAGNOSIS — G4733 Obstructive sleep apnea (adult) (pediatric): Secondary | ICD-10-CM | POA: Diagnosis not present

## 2019-12-02 DIAGNOSIS — Z13220 Encounter for screening for lipoid disorders: Secondary | ICD-10-CM | POA: Diagnosis not present

## 2019-12-02 DIAGNOSIS — G479 Sleep disorder, unspecified: Secondary | ICD-10-CM | POA: Diagnosis not present

## 2019-12-02 DIAGNOSIS — F339 Major depressive disorder, recurrent, unspecified: Secondary | ICD-10-CM | POA: Diagnosis not present

## 2019-12-02 DIAGNOSIS — Z0000 Encounter for general adult medical examination without abnormal findings: Secondary | ICD-10-CM | POA: Diagnosis not present

## 2019-12-09 DIAGNOSIS — G4733 Obstructive sleep apnea (adult) (pediatric): Secondary | ICD-10-CM | POA: Diagnosis not present

## 2020-01-10 DIAGNOSIS — Z20822 Contact with and (suspected) exposure to covid-19: Secondary | ICD-10-CM | POA: Diagnosis not present

## 2020-02-06 DIAGNOSIS — Z01419 Encounter for gynecological examination (general) (routine) without abnormal findings: Secondary | ICD-10-CM | POA: Diagnosis not present

## 2020-03-04 ENCOUNTER — Telehealth: Payer: Self-pay | Admitting: Medical

## 2020-03-04 ENCOUNTER — Ambulatory Visit: Payer: Self-pay

## 2020-03-04 NOTE — Progress Notes (Deleted)
   Subjective:    Patient ID: Jasmine Burke, female    DOB: Feb 19, 1966, 54 y.o.   MRN: [id number]  HPI  54 yo female   Review of Systems     Objective:   Physical Exam        Assessment & Plan:

## 2020-03-05 ENCOUNTER — Encounter: Payer: Self-pay | Admitting: Registered Nurse

## 2020-03-05 ENCOUNTER — Telehealth: Payer: Self-pay | Admitting: Registered Nurse

## 2020-03-05 ENCOUNTER — Other Ambulatory Visit: Payer: Self-pay

## 2020-03-05 ENCOUNTER — Ambulatory Visit: Payer: Self-pay

## 2020-03-05 VITALS — HR 64 | Resp 16

## 2020-03-05 DIAGNOSIS — Z20822 Contact with and (suspected) exposure to covid-19: Secondary | ICD-10-CM

## 2020-03-05 LAB — POC COVID19 BINAXNOW: SARS Coronavirus 2 Ag: NEGATIVE

## 2020-03-05 NOTE — Progress Notes (Signed)
Subjective:    Patient ID: Jasmine Burke, female    DOB: 04-21-1966, 54 y.o.   MRN: [id number]  54y/o female single fully covid vaccinated had known covid positive close contact 03/01/2020.  Works at [employer]  Here for testing today.  Adult daughters coming to visit this weekend  Should her boyfriend be tested?  Denied new symptoms has chronic fatigue and low back aches  RN Cummings completed POC [facility] rapid testing prior to telehealth appt  Patient consented to telephone visit.  This visit was conducted entirely via telephone/audio.  I spent 9 minutes on the telephone with patient.       Review of Systems  Constitutional: Positive for fatigue. Negative for chills and fever.  HENT: Negative for rhinorrhea, sneezing and sore throat.   Eyes: Negative for photophobia and visual disturbance.  Respiratory: Negative for cough and shortness of breath.   Cardiovascular: Negative for chest pain.  Gastrointestinal: Negative for diarrhea, nausea and vomiting.  Musculoskeletal: Positive for myalgias. Negative for gait problem, neck pain and neck stiffness.  Neurological: Negative for tremors, syncope, weakness and headaches.  Hematological: Negative for adenopathy. Does not bruise/bleed easily.  Psychiatric/Behavioral: Negative for agitation, confusion and sleep disturbance.       Objective:   Physical Exam Vitals and nursing note reviewed.  Constitutional:      General: She is awake. She is not in acute distress.    Appearance: Normal appearance. She is well-developed, well-groomed and normal weight. She is not ill-appearing, toxic-appearing or diaphoretic.  HENT:     Head: Normocephalic and atraumatic.     Jaw: There is normal jaw occlusion.     Right Ear: Hearing and external ear normal.     Left Ear: Hearing and external ear normal.     Nose: Nose normal.     Mouth/Throat:     Mouth: No angioedema.     Pharynx: Oropharynx is clear.  Eyes:     General: Lids are normal.  Vision grossly intact. Gaze aligned appropriately. No scleral icterus.       Right eye: No discharge.        Left eye: No discharge.     Extraocular Movements: Extraocular movements intact.     Conjunctiva/sclera: Conjunctivae normal.     Pupils: Pupils are equal, round, and reactive to light.  Neck:     Trachea: Trachea and phonation normal.  Cardiovascular:     Rate and Rhythm: Normal rate and regular rhythm.     Pulses: Normal pulses.  Pulmonary:     Effort: Pulmonary effort is normal. No respiratory distress.     Breath sounds: Normal breath sounds and air entry. No stridor or transmitted upper airway sounds. No wheezing.     Comments: Spoke full sentences without difficulty; no cough audible on telephone call Abdominal:     General: Abdomen is flat.  Musculoskeletal:        General: Normal range of motion.     Right shoulder: No deformity or laceration. Normal range of motion.     Left shoulder: No deformity or laceration. Normal range of motion.     Right elbow: No swelling, deformity or lacerations.     Left elbow: No swelling, deformity or lacerations.     Right hand: No swelling, deformity or lacerations.     Left hand: No swelling, deformity or lacerations.     Cervical back: Normal range of motion. No swelling, edema, deformity, erythema, signs of trauma, lacerations or rigidity.  Normal range of motion.     Thoracic back: No swelling, deformity, signs of trauma or lacerations.     Lumbar back: No swelling, deformity, signs of trauma or lacerations.     Right hip: No deformity or lacerations.     Left hip: No deformity or lacerations.     Right knee: No deformity or lacerations.     Left knee: No deformity or lacerations.  Skin:    General: Skin is warm and dry.     Coloration: Skin is not ashen, cyanotic, jaundiced, mottled, pale or sallow.     Findings: No abrasion, abscess, acne, bruising, burn, ecchymosis, erythema, signs of injury, laceration, lesion, petechiae, rash  or wound.     Nails: There is no clubbing.  Neurological:     General: No focal deficit present.     Mental Status: She is alert and oriented to person, place, and time. Mental status is at baseline.     GCS: GCS eye subscore is 4. GCS verbal subscore is 5. GCS motor subscore is 6.     Cranial Nerves: No cranial nerve deficit, dysarthria or facial asymmetry.     Motor: No weakness, tremor, atrophy, abnormal muscle tone or seizure activity.     Coordination: Coordination is intact. Coordination normal.     Gait: Gait is intact. Gait normal.     Comments: Gait sure and steady in parking lot  Psychiatric:        Attention and Perception: Attention and perception normal.        Mood and Affect: Mood and affect normal.        Speech: Speech normal.        Behavior: Behavior normal. Behavior is cooperative.        Thought Content: Thought content normal.        Cognition and Memory: Cognition and memory normal.        Judgment: Judgment normal.           Assessment & Plan:  A-close exposure covid 19, covid testing  P- Fully vaccinated; monitor for symptoms 14 days after 8/30 through 15 Mar 2020; wear mask, social distance, frequent handwashing as vaccinated may return to work after negative rapid test and no symptoms.  Since her test negative and no symptoms family/boyfriend testing not indicated at this time.  Contact clinic if symptoms develop and isolate from family if symptoms start per CDC guidelines and [employer] staff guidelines.  Patient notified via telephone negative Covid rapid test result.  May return to work today 03/05/2020, continue to wear mask when out in public and [employer] policy to wear while at work.  Refer to [email or web address] if further questions regarding covid prevention policies when on [employer].  Monitor for symptoms of covid e.g. runny nose, sore throat, headache, loss of taste/smell, nausea/vomiting/diarrhea, fever/chills, body aches, cough, shortness  of breath/dyspnea.  Patient denied any symptoms at this time.  Patient to notify clinic staff if symptoms develop.  Discussed repeat testing may be indicated if symptoms develop.  Discussed current research has shown that breakthrough covid infections are occurring with the delta variant but typically do not require hospitalization for fully vaccinated immunocompetent patients.  Discussed with patient expected 8 month pfizer covid booster vaccinatations to start late September 2021 but she will not be due until end of November/beginning of December with current guidance.  Discussed with patient covid spread and incidence in community high.  Continue to protect self with mask wear, hand  sanitizing washing, avoid touching face and maintaining social distancing 6 feet or greater.  [facility] handout on covid FAQ and covid testing to patient.  See email in [place] mailbox regarding schedule influenza vaccines on Wednesdays this month first date available 8 Sep.  Patient verbalized understanding of information/instructions, agreed with plan of care and had no further questions at this time.

## 2020-03-05 NOTE — Patient Instructions (Signed)
COVID-19 Frequently Asked Questions COVID-19 (coronavirus disease) is an infection that is caused by a large family of viruses. Some viruses cause illness in people and others cause illness in animals like camels, cats, and bats. In some cases, the viruses that cause illness in animals can spread to humans. Where did the coronavirus come from? In December 2019, [place] told the World Health Organization (WHO) of several cases of lung disease (human respiratory illness). These cases were linked to an open seafood and livestock market in the city of [place]. The link to the seafood and livestock market suggests that the virus may have spread from animals to humans. However, since that first outbreak in December, the virus has also been shown to spread from person to person. What is the name of the disease and the virus? Disease name Early on, this disease was called novel coronavirus. This is because scientists determined that the disease was caused by a new (novel) respiratory virus. The World Health Organization (WHO) has now named the disease COVID-19, or coronavirus disease. Virus name The virus that causes the disease is called severe acute respiratory syndrome coronavirus 2 (SARS-CoV-2). More information on disease and virus naming World Health Organization (WHO): www.who.int/emergencies/diseases/novel-coronavirus-2019/technical-guidance/naming-the-coronavirus-disease-(covid-2019)-and-the-virus-that-causes-it Who is at risk for complications from coronavirus disease? Some people may be at higher risk for complications from coronavirus disease. This includes older adults and people who have chronic diseases, such as heart disease, diabetes, and lung disease. If you are at higher risk for complications, take these extra precautions:  Stay home as much as possible.  Avoid social gatherings and travel.  Avoid close contact with others. Stay at least 6 ft (2 m) away from others, if possible.  Wash  your hands often with soap and water for at least 20 seconds.  Avoid touching your face, mouth, nose, or eyes.  Keep supplies on hand at home, such as food, medicine, and cleaning supplies.  If you must go out in public, wear a cloth face covering or face mask. Make sure your mask covers your nose and mouth. How does coronavirus disease spread? The virus that causes coronavirus disease spreads easily from person to person (is contagious). You may catch the virus by:  Breathing in droplets from an infected person. Droplets can be spread by a person breathing, speaking, singing, coughing, or sneezing.  Touching something, like a table or a doorknob, that was exposed to the virus (contaminated) and then touching your mouth, nose, or eyes. Can I get the virus from touching surfaces or objects? There is still a lot that we do not know about the virus that causes coronavirus disease. Scientists are basing a lot of information on what they know about similar viruses, such as:  Viruses cannot generally survive on surfaces for long. They need a human body (host) to survive.  It is more likely that the virus is spread by close contact with people who are sick (direct contact), such as through: ? Shaking hands or hugging. ? Breathing in respiratory droplets that travel through the air. Droplets can be spread by a person breathing, speaking, singing, coughing, or sneezing.  It is less likely that the virus is spread when a person touches a surface or object that has the virus on it (indirect contact). The virus may be able to enter the body if the person touches a surface or object and then touches his or her face, eyes, nose, or mouth. Can a person spread the virus without having symptoms of the disease?   It may be possible for the virus to spread before a person has symptoms of the disease, but this is most likely not the main way the virus is spreading. It is more likely for the virus to spread by  being in close contact with people who are sick and breathing in the respiratory droplets spread by a person breathing, speaking, singing, coughing, or sneezing. What are the symptoms of coronavirus disease? Symptoms vary from person to person and can range from mild to severe. Symptoms may include:  Fever or chills.  Cough.  Difficulty breathing or feeling short of breath.  Headaches, body aches, or muscle aches.  Runny or stuffy (congested) nose.  Sore throat.  New loss of taste or smell.  Nausea, vomiting, or diarrhea. These symptoms can appear anywhere from 2 to 14 days after you have been exposed to the virus. Some people may not have any symptoms. If you develop symptoms, call your health care provider. People with severe symptoms may need hospital care. Should I be tested for this virus? Your health care provider will decide whether to test you based on your symptoms, history of exposure, and your risk factors. How does a health care provider test for this virus? Health care providers will collect samples to send for testing. Samples may include:  Taking a swab of fluid from the back of your nose and throat, your nose, or your throat.  Taking fluid from the lungs by having you cough up mucus (sputum) into a sterile cup.  Taking a blood sample. Is there a treatment or vaccine for this virus? Currently, there is no vaccine to prevent coronavirus disease. Also, there are no medicines like antibiotics or antivirals to treat the virus. A person who becomes sick is given supportive care, which means rest and fluids. A person may also relieve his or her symptoms by using over-the-counter medicines that treat sneezing, coughing, and runny nose. These are the same medicines that a person takes for the common cold. If you develop symptoms, call your health care provider. People with severe symptoms may need hospital care. What can I do to protect myself and my family from this  virus?     You can protect yourself and your family by taking the same actions that you would take to prevent the spread of other viruses. Take the following actions:  Wash your hands often with soap and water for at least 20 seconds. If soap and water are not available, use alcohol-based hand sanitizer.  Avoid touching your face, mouth, nose, or eyes.  Cough or sneeze into a tissue, sleeve, or elbow. Do not cough or sneeze into your hand or the air. ? If you cough or sneeze into a tissue, throw it away immediately and wash your hands.  Disinfect objects and surfaces that you frequently touch every day.  Stay away from people who are sick.  Avoid going out in public, follow guidance from your state and local health authorities.  Avoid crowded indoor spaces. Stay at least 6 ft (2 m) away from others.  If you must go out in public, wear a cloth face covering or face mask. Make sure your mask covers your nose and mouth.  Stay home if you are sick, except to get medical care. Call your health care provider before you get medical care. Your health care provider will tell you how long to stay home.  Make sure your vaccines are up to date. Ask your health care provider what   vaccines you need. What should I do if I need to travel? Follow travel recommendations from your local health authority, the CDC, and WHO. Travel information and advice  Centers for Disease Control and Prevention (CDC): www.cdc.gov/coronavirus/2019-ncov/travelers/index.html  World Health Organization (WHO): www.who.int/emergencies/diseases/novel-coronavirus-2019/travel-advice Know the risks and take action to protect your health  You are at higher risk of getting coronavirus disease if you are traveling to areas with an outbreak or if you are exposed to travelers from areas with an outbreak.  Wash your hands often and practice good hygiene to lower the risk of catching or spreading the virus. What should I do if I  am sick? General instructions to stop the spread of infection  Wash your hands often with soap and water for at least 20 seconds. If soap and water are not available, use alcohol-based hand sanitizer.  Cough or sneeze into a tissue, sleeve, or elbow. Do not cough or sneeze into your hand or the air.  If you cough or sneeze into a tissue, throw it away immediately and wash your hands.  Stay home unless you must get medical care. Call your health care provider or local health authority before you get medical care.  Avoid public areas. Do not take public transportation, if possible.  If you can, wear a mask if you must go out of the house or if you are in close contact with someone who is not sick. Make sure your mask covers your nose and mouth. Keep your home clean  Disinfect objects and surfaces that are frequently touched every day. This may include: ? Counters and tables. ? Doorknobs and light switches. ? Sinks and faucets. ? Electronics such as phones, remote controls, keyboards, computers, and tablets.  Wash dishes in hot, soapy water or use a dishwasher. Air-dry your dishes.  Wash laundry in hot water. Prevent infecting other household members  Let healthy household members care for children and pets, if possible. If you have to care for children or pets, wash your hands often and wear a mask.  Sleep in a different bedroom or bed, if possible.  Do not share personal items, such as razors, toothbrushes, deodorant, combs, brushes, towels, and washcloths. Where to find more information Centers for Disease Control and Prevention (CDC)  Information and news updates: www.cdc.gov/coronavirus/2019-ncov World Health Organization (WHO)  Information and news updates: www.who.int/emergencies/diseases/novel-coronavirus-2019  Coronavirus health topic: www.who.int/health-topics/coronavirus  Questions and answers on COVID-19: www.who.int/news-room/q-a-detail/q-a-coronaviruses  Global  tracker: who.sprinklr.com American Academy of Pediatrics (AAP)  Information for families: www.healthychildren.org/English/health-issues/conditions/chest-lungs/Pages/2019-Novel-Coronavirus.aspx The coronavirus situation is changing rapidly. Check your local health authority website or the CDC and WHO websites for updates and news. When should I contact a health care provider?  Contact your health care provider if you have symptoms of an infection, such as fever or cough, and you: ? Have been near anyone who is known to have coronavirus disease. ? Have come into contact with a person who is suspected to have coronavirus disease. ? Have traveled to an area where there is an outbreak of COVID-19. When should I get emergency medical care?  Get help right away by calling your local emergency services (911 in the [place] if you have: ? Trouble breathing. ? Pain or pressure in your chest. ? Confusion. ? Blue-tinged lips and fingernails. ? Difficulty waking from sleep. ? Symptoms that get worse. Let the emergency medical personnel know if you think you have coronavirus disease. Summary  A new respiratory virus is spreading from person to person and causing   COVID-19 (coronavirus disease).  The virus that causes COVID-19 appears to spread easily. It spreads from one person to another through droplets from breathing, speaking, singing, coughing, or sneezing.  Older adults and those with chronic diseases are at higher risk of disease. If you are at higher risk for complications, take extra precautions.  There is currently no vaccine to prevent coronavirus disease. There are no medicines, such as antibiotics or antivirals, to treat the virus.  You can protect yourself and your family by washing your hands often, avoiding touching your face, and covering your coughs and sneezes. This information is not intended to replace advice given to you by your health care provider. Make sure you discuss any  questions you have with your health care provider. Document Revised: 04/18/2019 Document Reviewed: 10/15/2018 Elsevier Patient Education  2020 Else[employer]. COVID-19: What Your Test Results Mean If you test positive for COVID-19 Take steps to help prevent the spread of COVID-19 Stay home.  Do not leave your home, except to get medical care. Do not visit public areas. Get rest and stay hydrated. Take over-the-counter medicines, such as acetaminophen, to help you feel better. Stay in touch with your doctor. Separate yourself from other people.  As much as possible, stay in a specific room and away from other people and pets in your home. If you test negative for COVID-19  You probably were not infected at the time your sample was collected.  However, that does not mean you will not get sick.  It is possible that you were very early in your infection when your sample was collected and that you could test positive later. A negative test result does not mean you won't get sick later. cdc.gov/coronavirus 11/30/2018 This information is not intended to replace advice given to you by your health care provider. Make sure you discuss any questions you have with your health care provider. Document Revised: 06/05/2019 Document Reviewed: 06/05/2019 Elsevier Patient Education  2020 Elsevier Inc.  

## 2020-03-09 ENCOUNTER — Ambulatory Visit: Payer: Self-pay | Admitting: *Deleted

## 2020-03-09 ENCOUNTER — Other Ambulatory Visit: Payer: Self-pay

## 2020-03-09 ENCOUNTER — Telehealth: Payer: Self-pay | Admitting: Medical

## 2020-03-09 DIAGNOSIS — R0981 Nasal congestion: Secondary | ICD-10-CM

## 2020-03-09 DIAGNOSIS — R5383 Other fatigue: Secondary | ICD-10-CM

## 2020-03-09 DIAGNOSIS — Z20822 Contact with and (suspected) exposure to covid-19: Secondary | ICD-10-CM

## 2020-03-09 LAB — POC COVID19 BINAXNOW: SARS Coronavirus 2 Ag: NEGATIVE

## 2020-03-09 NOTE — Progress Notes (Signed)
° °  Subjective:    Patient ID: Jasmine Burke, female    DOB: 1966-05-18, 54 y.o.   MRN: [id number]  HPI 54 yo female in non acute distress, consents to telemedicine appointment. Exposure to Covid postive person (coworker works in same  work space, "they borrowed her glasses" ( she wears mask at work unless eating or drinking.) on 03/01/20 got tested on Friday POC Covid-19 test which was negative. Saturday 03/06/2020 started with congestion and fatigue, which continues today. Fever of  98.9 per patient.  Feels better today then the weekend.  Vaccinated with Moderna last injected  October 08, 2019.  Review of Systems  Constitutional: Positive for fatigue and fever.  HENT: Positive for congestion, postnasal drip, sinus pressure (mild per patient) and sore throat (slight). Negative for ear pain, rhinorrhea and sinus pain.   Respiratory: Negative for cough, shortness of breath and wheezing.   Cardiovascular: Negative for chest pain.  Gastrointestinal: Negative for abdominal pain, diarrhea, nausea and vomiting.  Genitourinary: Negative for difficulty urinating.  Musculoskeletal: Positive for back pain (thinks it was due to being in bed alot). Negative for myalgias.  Allergic/Immunologic: Positive for environmental allergies (usually spring if any).  Neurological: Positive for weakness. Negative for dizziness, light-headedness and headaches.       Objective:   Physical Exam AXOX3 No physical performed due to telemedicine appointment.   POC Covid 19 test negative PCR Covid-19 pending.    Assessment & Plan:  Close Contact exposure. Pending PCR result. Rest , increase fluids, OTC Motrin or Tylenol if not fever or not feeling well. Isolate at home till test results. If symptoms are improving and no fever x 24 hours without Motrin or Tylenol being taken, may return to work . Otherwise will need to discuss with provider on duty. Patient verbalizes understanding and has no further questions  at the end of our conversation.

## 2020-03-10 ENCOUNTER — Encounter: Payer: Self-pay | Admitting: Medical

## 2020-03-10 LAB — NOVEL CORONAVIRUS, NAA: SARS-CoV-2, NAA: NOT DETECTED

## 2020-03-10 NOTE — Patient Instructions (Signed)
Prevent the Spread of COVID-19 if You Are Sick If you are sick with COVID-19 or think you might have COVID-19, follow the steps below to care for yourself and to help protect other people in your home and community. Stay home except to get medical care.  Stay home. Most people with COVID-19 have mild illness and are able to recover at home without medical care. Do not leave your home, except to get medical care. Do not visit public areas.  Take care of yourself. Get rest and stay hydrated. Take over-the-counter medicines, such as acetaminophen, to help you feel better.  Stay in touch with your doctor. Call before you get medical care. Be sure to get care if you have trouble breathing, or have any other emergency warning signs, or if you think it is an emergency.  Avoid public transportation, ride-sharing, or taxis. Separate yourself from other people and pets in your home.  As much as possible, stay in a specific room and away from other people and pets in your home. Also, you should use a separate bathroom, if available. If you need to be around other people or animals in or outside of the home, wear a mask. ? See COVID-19 and Animals if you have questions about pets:www.cdc.gov/coronavirus/2019-ncov/faq.html#COVID19animals. ? Additional guidance is available for those living in close quarters. (https://www.cdc.gov/coronavirus/2019-ncov/daily-life-coping/living-in-close-quarters.html) and shared housing (https://www.cdc.gov/coronavirus/2019-ncov/daily-life-coping/shared-housing/index.html). Monitor your symptoms.  Symptoms of COVID-19 include fever, cough, and shortness of breath but other symptoms may be present as well.  Follow care instructions from your healthcare provider and local health department. Your local health authorities will give instructions on checking your symptoms and reporting information. When to Seek Emergency Medical Attention Look for emergency warning signs* for  COVID-19. If someone is showing any of these signs, seek emergency medical care immediately:  Trouble breathing  Persistent pain or pressure in the chest  New confusion  Bluish lips or face  Inability to wake or stay awake *This list is not all possible symptoms. Please call your medical provider for any other symptoms that are severe or concerning to you. Call 911 or call ahead to your local emergency facility: Notify the operator that you are seeking care for someone who has or may have COVID-19. Call ahead before visiting your doctor.  Call ahead. Many medical visits for routine care are being postponed or done by phone or telemedicine.  If you have a medical appointment that cannot be postponed, call your doctor's office, and tell them you have or may have COVID-19. If you are sick, wear a mask over your nose and mouth.  You should wear a mask over your nose and mouth if you must be around other people or animals, including pets (even at home).  You don't need to wear the mask if you are alone. If you can't put on a mask (because of trouble breathing for example), cover your coughs and sneezes in some other way. Try to stay at least 6 feet away from other people. This will help protect the people around you.  Masks should not be placed on young children under age 2 years, anyone who has trouble breathing, or anyone who is not able to remove the mask without help. Note: During the COVID-19 pandemic, medical grade facemasks are reserved for healthcare workers and some first responders. You may need to make a mask using a scarf or bandana. Cover your coughs and sneezes.  Cover your mouth and nose with a tissue when you cough or sneeze.    Throw used tissues in a lined trash can.  Immediately wash your hands with soap and water for at least 20 seconds. If soap and water are not available, clean your hands with an alcohol-based hand sanitizer that contains at least 60% alcohol. Clean  your hands often.  Wash your hands often with soap and water for at least 20 seconds. This is especially important after blowing your nose, coughing, or sneezing; going to the bathroom; and before eating or preparing food.  Use hand sanitizer if soap and water are not available. Use an alcohol-based hand sanitizer with at least 60% alcohol, covering all surfaces of your hands and rubbing them together until they feel dry.  Soap and water are the best option, especially if your hands are visibly dirty.  Avoid touching your eyes, nose, and mouth with unwashed hands. Avoid sharing personal household items.  Do not share dishes, drinking glasses, cups, eating utensils, towels, or bedding with other people in your home.  Wash these items thoroughly after using them with soap and water or put them in the dishwasher. Clean all "high-touch" surfaces everyday.  Clean and disinfect high-touch surfaces in your "sick room" and bathroom. Let someone else clean and disinfect surfaces in common areas, but not your bedroom and bathroom.  If a caregiver or other person needs to clean and disinfect a sick person's bedroom or bathroom, they should do so on an as-needed basis. The caregiver/other person should wear a mask and wait as long as possible after the sick person has used the bathroom. High-touch surfaces include phones, remote controls, counters, tabletops, doorknobs, bathroom fixtures, toilets, keyboards, tablets, and bedside tables.  Clean and disinfect areas that may have blood, stool, or body fluids on them.  Use household cleaners and disinfectants. Clean the area or item with soap and water or another detergent if it is dirty. Then use a household disinfectant. ? Be sure to follow the instructions on the label to ensure safe and effective use of the product. Many products recommend keeping the surface wet for several minutes to ensure germs are killed. Many also recommend precautions such as  wearing gloves and making sure you have good ventilation during use of the product. ? Most EPA-registered household disinfectants should be effective. When you can be around others after you had or likely had COVID-19 When you can be around others (end home isolation) depends on different factors for different situations.  I think or know I had COVID-19, and I had symptoms ? You can be with others after  24 hours with no fever AND  Symptoms improved AND  10 days since symptoms first appeared ? Depending on your healthcare provider's advice and availability of testing, you might get tested to see if you still have COVID-19. If you will be tested, you can be around others when you have no fever, symptoms have improved, and you receive two negative test results in a row, at least 24 hours apart.  I tested positive for COVID-19 but had no symptoms ? If you continue to have no symptoms, you can be with others after:  10 days have passed since test ? Depending on your healthcare provider's advice and availability of testing, you might get tested to see if you still have COVID-19. If you will be tested, you can be around others after you receive two negative test results in a row, at least 24 hours apart. ? If you develop symptoms after testing positive, follow the guidance above for "  I think or know I had COVID, and I had symptoms." cdc.gov/coronavirus 02/11/2019 This information is not intended to replace advice given to you by your health care provider. Make sure you discuss any questions you have with your health care provider. Document Revised: 02/27/2019 Document Reviewed: 12/31/2018 Elsevier Patient Education  2020 [employer] Inc.  

## 2020-03-12 DIAGNOSIS — Z20822 Contact with and (suspected) exposure to covid-19: Secondary | ICD-10-CM | POA: Diagnosis not present

## 2020-03-15 ENCOUNTER — Telehealth: Payer: Self-pay | Admitting: Medical

## 2020-03-15 ENCOUNTER — Encounter: Payer: Self-pay | Admitting: Medical

## 2020-03-31 NOTE — Telephone Encounter (Signed)
PCR Covid -19 testing negative, left message for patient to call back, also sent a MyChart message.

## 2020-04-14 DIAGNOSIS — Z411 Encounter for cosmetic surgery: Secondary | ICD-10-CM | POA: Diagnosis not present

## 2020-04-14 DIAGNOSIS — L720 Epidermal cyst: Secondary | ICD-10-CM | POA: Diagnosis not present

## 2020-04-14 DIAGNOSIS — L814 Other melanin hyperpigmentation: Secondary | ICD-10-CM | POA: Diagnosis not present

## 2020-04-14 DIAGNOSIS — D239 Other benign neoplasm of skin, unspecified: Secondary | ICD-10-CM | POA: Diagnosis not present

## 2020-04-27 DIAGNOSIS — R600 Localized edema: Secondary | ICD-10-CM | POA: Diagnosis not present

## 2020-06-04 DIAGNOSIS — G4733 Obstructive sleep apnea (adult) (pediatric): Secondary | ICD-10-CM | POA: Diagnosis not present

## 2020-06-04 DIAGNOSIS — F339 Major depressive disorder, recurrent, unspecified: Secondary | ICD-10-CM | POA: Diagnosis not present

## 2020-06-04 DIAGNOSIS — R5383 Other fatigue: Secondary | ICD-10-CM | POA: Diagnosis not present

## 2020-06-04 DIAGNOSIS — G479 Sleep disorder, unspecified: Secondary | ICD-10-CM | POA: Diagnosis not present

## 2020-07-10 DIAGNOSIS — Z1152 Encounter for screening for COVID-19: Secondary | ICD-10-CM | POA: Diagnosis not present

## 2020-07-15 DIAGNOSIS — H02845 Edema of left lower eyelid: Secondary | ICD-10-CM | POA: Diagnosis not present

## 2020-07-15 DIAGNOSIS — L720 Epidermal cyst: Secondary | ICD-10-CM | POA: Diagnosis not present

## 2020-07-15 DIAGNOSIS — H02842 Edema of right lower eyelid: Secondary | ICD-10-CM | POA: Diagnosis not present

## 2020-08-05 DIAGNOSIS — Z1231 Encounter for screening mammogram for malignant neoplasm of breast: Secondary | ICD-10-CM | POA: Diagnosis not present

## 2020-10-08 DIAGNOSIS — Z23 Encounter for immunization: Secondary | ICD-10-CM | POA: Diagnosis not present

## 2020-12-03 DIAGNOSIS — Z1211 Encounter for screening for malignant neoplasm of colon: Secondary | ICD-10-CM | POA: Diagnosis not present

## 2020-12-03 DIAGNOSIS — K640 First degree hemorrhoids: Secondary | ICD-10-CM | POA: Diagnosis not present

## 2020-12-03 DIAGNOSIS — K635 Polyp of colon: Secondary | ICD-10-CM | POA: Diagnosis not present

## 2020-12-03 DIAGNOSIS — Z8371 Family history of colonic polyps: Secondary | ICD-10-CM | POA: Diagnosis not present

## 2020-12-16 DIAGNOSIS — G4733 Obstructive sleep apnea (adult) (pediatric): Secondary | ICD-10-CM | POA: Diagnosis not present

## 2021-03-30 ENCOUNTER — Ambulatory Visit: Payer: Self-pay

## 2021-03-30 DIAGNOSIS — R6882 Decreased libido: Secondary | ICD-10-CM | POA: Diagnosis not present

## 2021-03-30 DIAGNOSIS — Z01419 Encounter for gynecological examination (general) (routine) without abnormal findings: Secondary | ICD-10-CM | POA: Diagnosis not present

## 2021-04-05 DIAGNOSIS — Z23 Encounter for immunization: Secondary | ICD-10-CM | POA: Diagnosis not present

## 2021-04-22 DIAGNOSIS — G479 Sleep disorder, unspecified: Secondary | ICD-10-CM | POA: Diagnosis not present

## 2021-04-22 DIAGNOSIS — F339 Major depressive disorder, recurrent, unspecified: Secondary | ICD-10-CM | POA: Diagnosis not present

## 2021-04-22 DIAGNOSIS — Z13220 Encounter for screening for lipoid disorders: Secondary | ICD-10-CM | POA: Diagnosis not present

## 2021-04-22 DIAGNOSIS — Z131 Encounter for screening for diabetes mellitus: Secondary | ICD-10-CM | POA: Diagnosis not present

## 2021-04-22 DIAGNOSIS — M549 Dorsalgia, unspecified: Secondary | ICD-10-CM | POA: Diagnosis not present

## 2021-04-22 DIAGNOSIS — Z0000 Encounter for general adult medical examination without abnormal findings: Secondary | ICD-10-CM | POA: Diagnosis not present

## 2021-05-06 DIAGNOSIS — Z23 Encounter for immunization: Secondary | ICD-10-CM | POA: Diagnosis not present

## 2021-05-11 DIAGNOSIS — Z23 Encounter for immunization: Secondary | ICD-10-CM | POA: Diagnosis not present

## 2021-05-11 DIAGNOSIS — L814 Other melanin hyperpigmentation: Secondary | ICD-10-CM | POA: Diagnosis not present

## 2021-05-11 DIAGNOSIS — L578 Other skin changes due to chronic exposure to nonionizing radiation: Secondary | ICD-10-CM | POA: Diagnosis not present

## 2021-05-11 DIAGNOSIS — D239 Other benign neoplasm of skin, unspecified: Secondary | ICD-10-CM | POA: Diagnosis not present

## 2021-08-11 DIAGNOSIS — Z1231 Encounter for screening mammogram for malignant neoplasm of breast: Secondary | ICD-10-CM | POA: Diagnosis not present

## 2021-09-05 ENCOUNTER — Encounter: Payer: Self-pay | Admitting: Medical

## 2021-09-05 ENCOUNTER — Ambulatory Visit: Payer: Self-pay | Admitting: Medical

## 2021-09-05 ENCOUNTER — Other Ambulatory Visit: Payer: Self-pay

## 2021-09-05 VITALS — BP 114/76 | HR 61 | Temp 97.0°F | Resp 16

## 2021-09-05 DIAGNOSIS — J302 Other seasonal allergic rhinitis: Secondary | ICD-10-CM

## 2021-09-05 DIAGNOSIS — H6992 Unspecified Eustachian tube disorder, left ear: Secondary | ICD-10-CM

## 2021-09-05 DIAGNOSIS — H6982 Other specified disorders of Eustachian tube, left ear: Secondary | ICD-10-CM

## 2021-09-05 DIAGNOSIS — H6121 Impacted cerumen, right ear: Secondary | ICD-10-CM

## 2021-09-05 NOTE — Progress Notes (Signed)
? ?  Subjective:  ? ? Patient ID: Jasmine Burke, female    DOB: [birth year], 56 y.o.   MRN: [id number] ? ?HPI ?56 yo female in non acute distress with right ear discomfort. Starteing  30 days ago. ?Pressure in ears  fluid or wax build up she is not sure. ? ? ?Spring time gets nasal congestion, outside during the weekend raking leaves.   ?Blood pressure 114/76, pulse 61, temperature (!) 97 ?F (36.1 ?C), temperature source Tympanic, resp. rate 16, SpO2 97 %. ? ?No Known Allergies ?. ? ?Review of Systems  ?Constitutional:  Negative for chills and fever.  ? ?   ?Objective:  ? Physical Exam ?Constitutional:   ?   Appearance: Normal appearance. She is normal weight.  ?HENT:  ?   Head: Normocephalic and atraumatic.  ?   Right Ear: Ear canal and external ear normal. There is impacted cerumen.  ?   Left Ear: Ear canal and external ear normal.  ?   Nose: Congestion (mild) present.  ?   Mouth/Throat:  ?   Mouth: Mucous membranes are moist.  ?   Pharynx: Oropharynx is clear.  ?Eyes:  ?   Extraocular Movements: Extraocular movements intact.  ?   Conjunctiva/sclera: Conjunctivae normal.  ?   Pupils: Pupils are equal, round, and reactive to light.  ?Cardiovascular:  ?   Rate and Rhythm: Normal rate and regular rhythm.  ?   Heart sounds: Normal heart sounds.  ?Pulmonary:  ?   Effort: Pulmonary effort is normal.  ?   Breath sounds: Normal breath sounds.  ?Musculoskeletal:     ?   General: Normal range of motion.  ?Skin: ?   General: Skin is warm and dry.  ?   Capillary Refill: Capillary refill takes less than 2 seconds.  ?Neurological:  ?   General: No focal deficit present.  ?   Mental Status: She is alert and oriented to person, place, and time. Mental status is at baseline.  ?Psychiatric:     ?   Mood and Affect: Mood normal.     ?   Behavior: Behavior normal.     ?   Thought Content: Thought content normal.     ?   Judgment: Judgment normal.  ? ? ? ? ? ?   ?Assessment & Plan:  ?Cerumen impaction right ear ?Eustachian tube  dysfunction left. ?Seasonal allergies. ? ?OTC Debrox gtts 5-10 drops into the right ear twice daily, x 4 days. Then return to clinic for ear irrigation. ?Start non sedative antihistimine like OTC Claritin daily for left ear.  ?Patient verbalizes understanding and has no questions at discharge. ? ?

## 2021-09-05 NOTE — Patient Instructions (Signed)
Earwax Buildup, Adult ?The ears produce a substance called earwax that helps keep bacteria out of the ear and protects the skin in the ear canal. Occasionally, earwax can build up in the ear and cause discomfort or hearing loss. ?What are the causes? ?This condition is caused by a buildup of earwax. Ear canals are self-cleaning. Ear wax is made in the outer part of the ear canal and generally falls out in small amounts over time. ?When the self-cleaning mechanism is not working, earwax builds up and can cause decreased hearing and discomfort. Attempting to clean ears with cotton swabs can push the earwax deep into the ear canal and cause decreased hearing and pain. ?What increases the risk? ?This condition is more likely to develop in people who: ?Clean their ears often with cotton swabs. ?Pick at their ears. ?Use earplugs or in-ear headphones often, or wear hearing aids. ?The following factors may also make you more likely to develop this condition: ?Being female. ?Being of older age. ?Naturally producing more earwax. ?Having narrow ear canals. ?Having earwax that is overly thick or sticky. ?Having excess hair in the ear canal. ?Having eczema. ?Being dehydrated. ?What are the signs or symptoms? ?Symptoms of this condition include: ?Reduced or muffled hearing. ?A feeling of fullness in the ear or feeling that the ear is plugged. ?Fluid coming from the ear. ?Ear pain or an itchy ear. ?Ringing in the ear. ?Coughing. ?Balance problems. ?An obvious piece of earwax that can be seen inside the ear canal. ?How is this diagnosed? ?This condition may be diagnosed based on: ?Your symptoms. ?Your medical history. ?An ear exam. During the exam, your health care provider will look into your ear with an instrument called an otoscope. ?You may have tests, including a hearing test. ?How is this treated? ?This condition may be treated by: ?Using ear drops to soften the earwax. ?Having the earwax removed by a health care provider. The  health care provider may: ?Flush the ear with water. ?Use an instrument that has a loop on the end (curette). ?Use a suction device. ?Having surgery to remove the wax buildup. This may be done in severe cases. ?Follow these instructions at home: ? ?Take over-the-counter and prescription medicines only as told by your health care provider. ?Do not put any objects, including cotton swabs, into your ear. You can clean the opening of your ear canal with a washcloth or facial tissue. ?Follow instructions from your health care provider about cleaning your ears. Do not overclean your ears. ?Drink enough fluid to keep your urine pale yellow. This will help to thin the earwax. ?Keep all follow-up visits as told. If earwax builds up in your ears often or if you use hearing aids, consider seeing your health care provider for routine, preventive ear cleanings. Ask your health care provider how often you should schedule your cleanings. ?If you have hearing aids, clean them according to instructions from the manufacturer and your health care provider. ?Contact a health care provider if: ?You have ear pain. ?You develop a fever. ?You have pus or other fluid coming from your ear. ?You have hearing loss. ?You have ringing in your ears that does not go away. ?You feel like the room is spinning (vertigo). ?Your symptoms do not improve with treatment. ?Get help right away if: ?You have bleeding from the affected ear. ?You have severe ear pain. ?Summary ?Earwax can build up in the ear and cause discomfort or hearing loss. ?The most common symptoms of this condition include   reduced or muffled hearing, a feeling of fullness in the ear, or feeling that the ear is plugged. ?This condition may be diagnosed based on your symptoms, your medical history, and an ear exam. ?This condition may be treated by using ear drops to soften the earwax or by having the earwax removed by a health care provider. ?Do not put any objects, including cotton  swabs, into your ear. You can clean the opening of your ear canal with a washcloth or facial tissue. ?This information is not intended to replace advice given to you by your health care provider. Make sure you discuss any questions you have with your health care provider. ?Document Revised: 10/07/2019 Document Reviewed: 10/07/2019 ?Elsevier Patient Education ? 2022 [employer] ? ?

## 2021-09-09 ENCOUNTER — Encounter: Payer: Self-pay | Admitting: Nurse Practitioner

## 2021-09-09 ENCOUNTER — Other Ambulatory Visit: Payer: Self-pay

## 2021-09-09 ENCOUNTER — Ambulatory Visit: Payer: Self-pay | Admitting: Nurse Practitioner

## 2021-09-09 VITALS — BP 120/74 | HR 69 | Temp 97.6°F | Resp 16

## 2021-09-09 DIAGNOSIS — H6121 Impacted cerumen, right ear: Secondary | ICD-10-CM

## 2021-09-09 NOTE — Progress Notes (Signed)
? ?  Subjective:  ? ? Patient ID: Jasmine Burke, female    DOB: Aug 13, 1965, 56 y.o.   MRN: [id number] ? ?HPI ? ?56 year old returning for ear check, has been using Claritin OTC and Debrox without relief  ? ? ?Review of Systems  ?Constitutional: Negative.   ?HENT:  Positive for ear pain.   ?Respiratory: Negative.    ?Cardiovascular: Negative.   ?Genitourinary: Negative.   ?Musculoskeletal: Negative.   ?Neurological: Negative.   ? ?Today's Vitals  ? 09/09/21 0908  ?BP: 120/74  ?Pulse: 69  ?Resp: 16  ?Temp: 97.6 ?F (36.4 ?C)  ?TempSrc: Tympanic  ?SpO2: 97%  ? ?There is no height or weight on file to calculate BMI.  ? ?   ?Objective:  ? Physical Exam ?HENT:  ?   Head: Normocephalic.  ?   Right Ear: External ear normal. There is impacted cerumen.  ?   Left Ear: Hearing, tympanic membrane, ear canal and external ear normal.  ?Skin: ?   General: Skin is warm.  ?Neurological:  ?   General: No focal deficit present.  ?   Mental Status: She is alert.  ?Psychiatric:     ?   Mood and Affect: Mood normal.  ? ? ? ? ? ?   ?Assessment & Plan:  ?1. Impacted cerumen of right ear ? ?- EAR CERUMEN REMOVAL-performed with warm water and peroxide rinse.  ?Patient tolerated well  ? ?Cerumen removed patient advised to use flonase nasal spray to continue nasal and sinus relief as well ? ?RTC if symptoms persist or with new concerns  ?   ? ?

## 2021-10-31 DIAGNOSIS — M546 Pain in thoracic spine: Secondary | ICD-10-CM | POA: Diagnosis not present

## 2021-11-11 DIAGNOSIS — M4722 Other spondylosis with radiculopathy, cervical region: Secondary | ICD-10-CM | POA: Diagnosis not present

## 2021-11-17 DIAGNOSIS — M5412 Radiculopathy, cervical region: Secondary | ICD-10-CM | POA: Diagnosis not present

## 2021-11-22 DIAGNOSIS — M5412 Radiculopathy, cervical region: Secondary | ICD-10-CM | POA: Diagnosis not present

## 2021-11-24 DIAGNOSIS — M5412 Radiculopathy, cervical region: Secondary | ICD-10-CM | POA: Diagnosis not present

## 2021-11-30 DIAGNOSIS — M5412 Radiculopathy, cervical region: Secondary | ICD-10-CM | POA: Diagnosis not present

## 2021-12-01 DIAGNOSIS — M5412 Radiculopathy, cervical region: Secondary | ICD-10-CM | POA: Diagnosis not present

## 2021-12-02 DIAGNOSIS — M4722 Other spondylosis with radiculopathy, cervical region: Secondary | ICD-10-CM | POA: Diagnosis not present

## 2021-12-05 ENCOUNTER — Other Ambulatory Visit: Payer: Self-pay | Admitting: Orthopedic Surgery

## 2021-12-05 DIAGNOSIS — M5412 Radiculopathy, cervical region: Secondary | ICD-10-CM

## 2021-12-05 DIAGNOSIS — M542 Cervicalgia: Secondary | ICD-10-CM

## 2021-12-06 DIAGNOSIS — M5412 Radiculopathy, cervical region: Secondary | ICD-10-CM | POA: Diagnosis not present

## 2021-12-08 DIAGNOSIS — M5412 Radiculopathy, cervical region: Secondary | ICD-10-CM | POA: Diagnosis not present

## 2021-12-09 ENCOUNTER — Ambulatory Visit
Admission: RE | Admit: 2021-12-09 | Discharge: 2021-12-09 | Disposition: A | Discharge status: Home / Self Care | Payer: BC Managed Care – PPO | Source: Ambulatory Visit | Attending: Orthopedic Surgery | Admitting: Orthopedic Surgery

## 2021-12-09 DIAGNOSIS — M542 Cervicalgia: Secondary | ICD-10-CM | POA: Diagnosis not present

## 2021-12-09 DIAGNOSIS — M5412 Radiculopathy, cervical region: Secondary | ICD-10-CM

## 2021-12-09 DIAGNOSIS — M4802 Spinal stenosis, cervical region: Secondary | ICD-10-CM | POA: Diagnosis not present

## 2021-12-13 DIAGNOSIS — M5412 Radiculopathy, cervical region: Secondary | ICD-10-CM | POA: Diagnosis not present

## 2021-12-14 DIAGNOSIS — M4722 Other spondylosis with radiculopathy, cervical region: Secondary | ICD-10-CM | POA: Diagnosis not present

## 2021-12-15 DIAGNOSIS — M5412 Radiculopathy, cervical region: Secondary | ICD-10-CM | POA: Diagnosis not present

## 2021-12-19 DIAGNOSIS — M5412 Radiculopathy, cervical region: Secondary | ICD-10-CM | POA: Diagnosis not present

## 2021-12-20 ENCOUNTER — Other Ambulatory Visit: Payer: Self-pay | Admitting: Physical Medicine and Rehabilitation

## 2021-12-20 DIAGNOSIS — M5412 Radiculopathy, cervical region: Secondary | ICD-10-CM

## 2021-12-22 ENCOUNTER — Ambulatory Visit
Admission: RE | Admit: 2021-12-22 | Discharge: 2021-12-22 | Disposition: A | Discharge status: Home / Self Care | Payer: BC Managed Care – PPO | Source: Ambulatory Visit | Attending: Physical Medicine and Rehabilitation | Admitting: Physical Medicine and Rehabilitation

## 2021-12-22 DIAGNOSIS — M5412 Radiculopathy, cervical region: Secondary | ICD-10-CM

## 2021-12-22 DIAGNOSIS — M47812 Spondylosis without myelopathy or radiculopathy, cervical region: Secondary | ICD-10-CM | POA: Diagnosis not present

## 2021-12-22 MED ORDER — TRIAMCINOLONE ACETONIDE 40 MG/ML IJ SUSP (RADIOLOGY)
60.0000 mg | Freq: Once | INTRAMUSCULAR | Status: AC
  Administered 2021-12-22: 60 mg via EPIDURAL

## 2021-12-22 MED ORDER — IOPAMIDOL (ISOVUE-M 300) INJECTION 61%
1.0000 mL | Freq: Once | INTRAMUSCULAR | Status: AC
  Administered 2021-12-22: 1 mL via EPIDURAL

## 2021-12-22 NOTE — Discharge Instructions (Signed)

## 2022-01-27 DIAGNOSIS — M5412 Radiculopathy, cervical region: Secondary | ICD-10-CM | POA: Diagnosis not present

## 2022-03-27 DIAGNOSIS — N951 Menopausal and female climacteric states: Secondary | ICD-10-CM | POA: Diagnosis not present

## 2022-03-27 DIAGNOSIS — Z780 Asymptomatic menopausal state: Secondary | ICD-10-CM | POA: Diagnosis not present

## 2022-03-29 ENCOUNTER — Other Ambulatory Visit: Payer: Self-pay | Admitting: Nurse Practitioner

## 2022-03-29 DIAGNOSIS — Z780 Asymptomatic menopausal state: Secondary | ICD-10-CM

## 2022-04-18 ENCOUNTER — Other Ambulatory Visit: Payer: BC Managed Care – PPO

## 2022-04-18 DIAGNOSIS — Z23 Encounter for immunization: Secondary | ICD-10-CM | POA: Diagnosis not present

## 2022-05-02 ENCOUNTER — Other Ambulatory Visit: Payer: BC Managed Care – PPO

## 2022-05-08 DIAGNOSIS — Z0000 Encounter for general adult medical examination without abnormal findings: Secondary | ICD-10-CM | POA: Diagnosis not present

## 2022-05-08 DIAGNOSIS — Z13220 Encounter for screening for lipoid disorders: Secondary | ICD-10-CM | POA: Diagnosis not present

## 2022-05-08 DIAGNOSIS — Z8249 Family history of ischemic heart disease and other diseases of the circulatory system: Secondary | ICD-10-CM | POA: Diagnosis not present

## 2022-05-08 DIAGNOSIS — F339 Major depressive disorder, recurrent, unspecified: Secondary | ICD-10-CM | POA: Diagnosis not present

## 2022-05-08 DIAGNOSIS — G4733 Obstructive sleep apnea (adult) (pediatric): Secondary | ICD-10-CM | POA: Diagnosis not present

## 2022-05-12 DIAGNOSIS — L814 Other melanin hyperpigmentation: Secondary | ICD-10-CM | POA: Diagnosis not present

## 2022-05-12 DIAGNOSIS — L821 Other seborrheic keratosis: Secondary | ICD-10-CM | POA: Diagnosis not present

## 2022-05-12 DIAGNOSIS — L578 Other skin changes due to chronic exposure to nonionizing radiation: Secondary | ICD-10-CM | POA: Diagnosis not present

## 2022-05-12 DIAGNOSIS — D239 Other benign neoplasm of skin, unspecified: Secondary | ICD-10-CM | POA: Diagnosis not present

## 2022-05-23 ENCOUNTER — Other Ambulatory Visit: Payer: BC Managed Care – PPO

## 2022-07-13 ENCOUNTER — Ambulatory Visit
Admission: RE | Admit: 2022-07-13 | Discharge: 2022-07-13 | Disposition: A | Discharge status: Home / Self Care | Payer: BC Managed Care – PPO | Source: Ambulatory Visit | Attending: Nurse Practitioner | Admitting: Nurse Practitioner

## 2022-07-13 DIAGNOSIS — Z780 Asymptomatic menopausal state: Secondary | ICD-10-CM | POA: Diagnosis not present

## 2022-08-17 DIAGNOSIS — Z1231 Encounter for screening mammogram for malignant neoplasm of breast: Secondary | ICD-10-CM | POA: Diagnosis not present

## 2022-10-30 ENCOUNTER — Ambulatory Visit (INDEPENDENT_AMBULATORY_CARE_PROVIDER_SITE_OTHER): Payer: Self-pay | Admitting: Physician Assistant

## 2022-10-30 VITALS — BP 120/70 | HR 64 | Temp 97.7°F | Resp 16 | Ht 63.5 in | Wt 164.0 lb

## 2022-10-30 DIAGNOSIS — M25562 Pain in left knee: Secondary | ICD-10-CM

## 2022-10-30 NOTE — Progress Notes (Signed)
[facility] [address] [place], [place] [address]   Office Visit Note  Patient Name: Jasmine Burke Date of Birth [id number]  Medical Record number [id number]  Date of Service: 10/30/2022  Chief Complaint  Patient presents with   Knee Pain     57 y/o F presents to the clinic for c/o R knee pain x 2 weeks, but started with left knee pain 2 days after. Her R knee felt better though. She denies fall or direct trauma. No previous injury to the same area before. Worse pain with standing or walking. Less pain with sitting. Rates pain at 5/10. No radicular symptoms. Taking Advil does help.    Knee Pain       Current Medication:  Outpatient Encounter Medications as of 10/30/2022  Medication Sig   CHOLECALCIFEROL PO Take 3,000 Units by mouth daily.   citalopram (CELEXA) 40 MG tablet Take 40 mg by mouth daily.   clonazePAM (KLONOPIN) 0.5 MG tablet TAKE 1/2  1 TABLET BY MOUTH ONCE DAILY AS NEEDED   Multiple Vitamin (MULTIVITAMIN WITH MINERALS) TABS tablet Take 1 tablet by mouth daily.   No facility-administered encounter medications on file as of 10/30/2022.      Medical History: No past medical history on file.   Vital Signs: BP 120/70 (BP Location: Left Arm, Patient Position: Sitting, Cuff Size: Normal)   Pulse 64   Temp 97.7 F (36.5 C) (Tympanic)   Resp 16   Ht 5' 3.5" (1.613 m)   Wt 164 lb (74.4 kg)   SpO2 98%   BMI 28.60 kg/m    Review of Systems  Constitutional: Negative.   Musculoskeletal:  Positive for arthralgias, gait problem and neck pain. Negative for joint swelling.    Physical Exam Constitutional:      Appearance: Normal appearance.  HENT:     Head: Normocephalic and atraumatic.     Right Ear: External ear normal.     Left Ear: External ear normal.  Eyes:     Extraocular Movements: Extraocular movements intact.  Musculoskeletal:     Left knee: Crepitus present. No swelling, effusion, erythema or bony tenderness. Normal range of  motion. Tenderness present.       Legs:     Comments: L knee: +crepitus. No swelling or effusion. Full ROM. No ecchymosis. No wounds. Generalized tenderness of the knee. Gait is with a slight limp.   Skin:    General: Skin is warm and dry.  Neurological:     Mental Status: She is alert and oriented to person, place, and time.  Psychiatric:        Mood and Affect: Mood normal.        Behavior: Behavior normal.       Assessment/Plan:  1. Acute pain of left knee  Apply ice.  Take Ibuprofen as directed on the bottle.  Limit activity. Continue to watch for worsening symptoms. Briefly discussed referral to Ortho if symptoms don't improve with conservative treatment. She verbalized understanding and in agreement.  Deferred x-rays since her MOI is non-traumatic, but unkonwn. Pt verbalized understanding and in agreement.  RTC in 2-3 weeks or sooner if any difficulty arises.    General Counseling: alvine mostafa understanding of the findings of todays visit and agrees with plan of treatment. I have discussed any further diagnostic evaluation that may be needed or ordered today. We also reviewed her medications today. she has been encouraged to call the office with any questions or concerns that should  arise related to todays visit.    Time spent:20 Minutes    Gilberto Better, [place] Physician Assistant

## 2023-04-03 DIAGNOSIS — Z23 Encounter for immunization: Secondary | ICD-10-CM | POA: Diagnosis not present

## 2023-04-05 DIAGNOSIS — Z01419 Encounter for gynecological examination (general) (routine) without abnormal findings: Secondary | ICD-10-CM | POA: Diagnosis not present

## 2023-05-12 IMAGING — XA DG INJECT/[PERSON_NAME] INC NEEDLE/CATH/PLC EPI/CERV/THOR W/IMG
2 series · 2 of 2 positions shown · non-contrast
Comparison: none

CLINICAL DATA: Cervical spondylosis without myelopathy. Neck and
right arm pain.

[Series 1: ortho standard · 1 of 1 slices shown (1 of 2)]
[im 1/1]
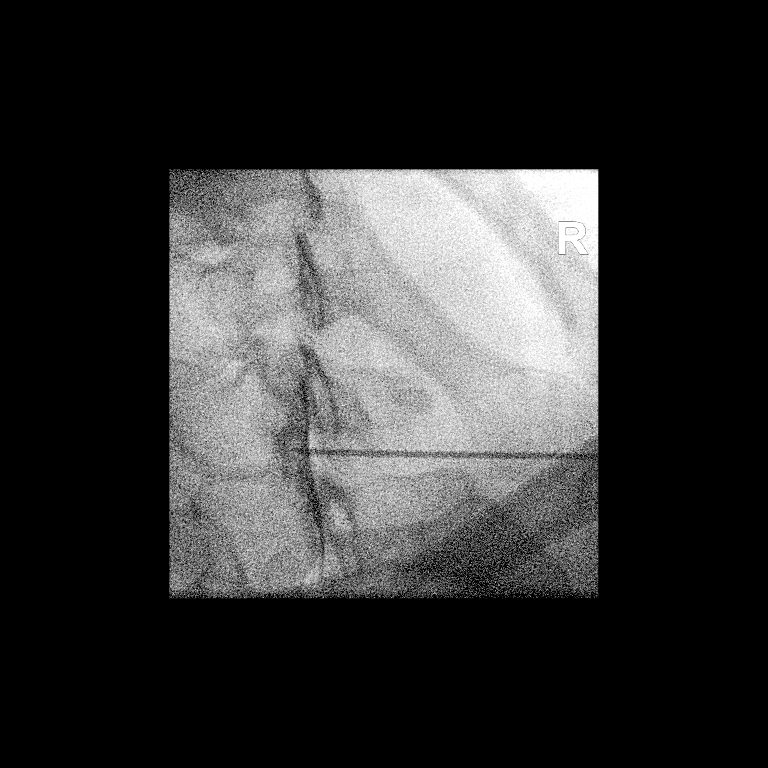

[Series 2: ortho standard · 1 of 1 slices shown (2 of 2)]
[im 1/1]
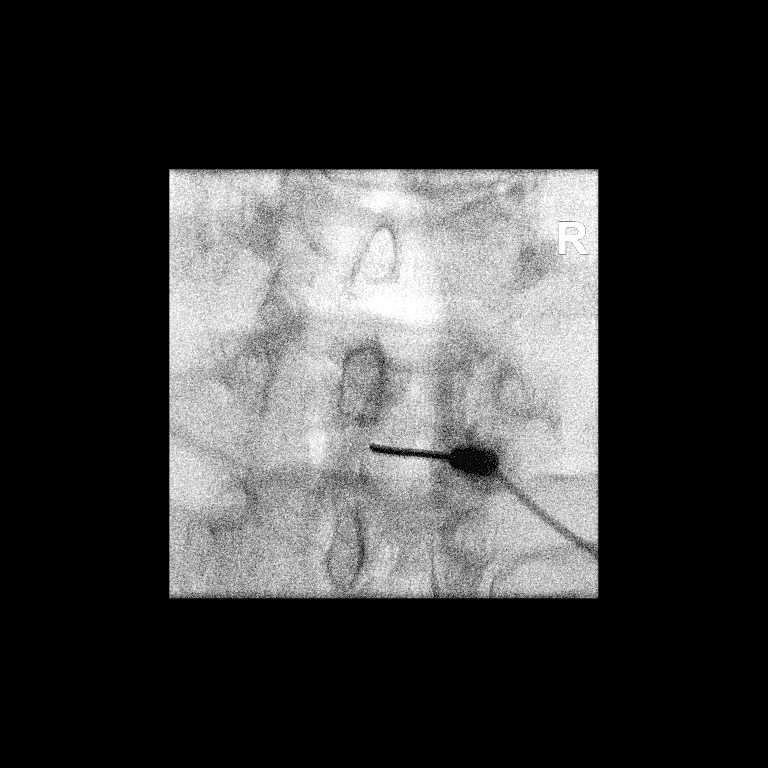

[2 of 2 positions shown; findings below may reference images not displayed]

FLUOROSCOPY:
Radiation Exposure Index (as provided by the fluoroscopic device):
0.70 mGy Kerma

PROCEDURE:
The procedure, risks, benefits, and alternatives were explained to
the patient. Questions regarding the procedure were encouraged and
answered. The patient understands and consents to the procedure.

CERVICAL EPIDURAL INJECTION

An interlaminar approach was performed on the right at C7-T1. A
inch 20 gauge epidural needle was advanced using loss-of-resistance
technique.

DIAGNOSTIC EPIDURAL INJECTION

Injection of Isovue-M 300 shows a good epidural pattern with spread
above and below the level of needle placement, primarily on the
right. No vascular opacification is seen.

THERAPEUTIC EPIDURAL INJECTION

1.5 ml of Kenalog 40 mixed with 2 ml of normal saline were then
instilled. The procedure was well-tolerated, and the patient was
discharged thirty minutes following the injection in good condition.
IMPRESSION: Technically successful interlaminar epidural injection on the right
at C7-T1.

## 2023-05-14 DIAGNOSIS — F339 Major depressive disorder, recurrent, unspecified: Secondary | ICD-10-CM | POA: Diagnosis not present

## 2023-05-14 DIAGNOSIS — Z23 Encounter for immunization: Secondary | ICD-10-CM | POA: Diagnosis not present

## 2023-05-14 DIAGNOSIS — G4733 Obstructive sleep apnea (adult) (pediatric): Secondary | ICD-10-CM | POA: Diagnosis not present

## 2023-05-14 DIAGNOSIS — Z0000 Encounter for general adult medical examination without abnormal findings: Secondary | ICD-10-CM | POA: Diagnosis not present

## 2023-05-14 DIAGNOSIS — E7800 Pure hypercholesterolemia, unspecified: Secondary | ICD-10-CM | POA: Diagnosis not present

## 2023-05-14 DIAGNOSIS — F419 Anxiety disorder, unspecified: Secondary | ICD-10-CM | POA: Diagnosis not present

## 2023-05-14 DIAGNOSIS — Z131 Encounter for screening for diabetes mellitus: Secondary | ICD-10-CM | POA: Diagnosis not present

## 2023-05-16 DIAGNOSIS — D239 Other benign neoplasm of skin, unspecified: Secondary | ICD-10-CM | POA: Diagnosis not present

## 2023-05-16 DIAGNOSIS — D225 Melanocytic nevi of trunk: Secondary | ICD-10-CM | POA: Diagnosis not present

## 2023-05-16 DIAGNOSIS — Z411 Encounter for cosmetic surgery: Secondary | ICD-10-CM | POA: Diagnosis not present

## 2023-05-16 DIAGNOSIS — R202 Paresthesia of skin: Secondary | ICD-10-CM | POA: Diagnosis not present

## 2023-05-16 DIAGNOSIS — D485 Neoplasm of uncertain behavior of skin: Secondary | ICD-10-CM | POA: Diagnosis not present

## 2023-08-02 DIAGNOSIS — Z713 Dietary counseling and surveillance: Secondary | ICD-10-CM | POA: Diagnosis not present

## 2023-08-02 DIAGNOSIS — E7800 Pure hypercholesterolemia, unspecified: Secondary | ICD-10-CM | POA: Diagnosis not present

## 2023-08-23 DIAGNOSIS — Z1231 Encounter for screening mammogram for malignant neoplasm of breast: Secondary | ICD-10-CM | POA: Diagnosis not present

## 2023-09-19 ENCOUNTER — Other Ambulatory Visit: Payer: Self-pay

## 2023-09-19 ENCOUNTER — Encounter: Payer: Self-pay | Admitting: Adult Health

## 2023-09-19 ENCOUNTER — Ambulatory Visit (INDEPENDENT_AMBULATORY_CARE_PROVIDER_SITE_OTHER): Payer: Self-pay | Admitting: Adult Health

## 2023-09-19 VITALS — BP 122/74 | HR 60 | Temp 96.2°F | Ht 63.5 in | Wt 155.0 lb

## 2023-09-19 DIAGNOSIS — M79644 Pain in right finger(s): Secondary | ICD-10-CM

## 2023-09-19 MED ORDER — MELOXICAM 15 MG PO TABS: 15.0000 mg | ORAL_TABLET | Freq: Every day | ORAL | 0 refills | Status: AC

## 2023-09-19 NOTE — Progress Notes (Signed)
 [facility] [address] [place], [place] [address]   Office Visit Note  Patient Name: Jasmine Burke Date of Birth [id number]  Medical Record number [id number]  Date of Service: 09/19/2023  Chief Complaint  Patient presents with   Acute Visit    Patient c/o R thumb soreness. She has been having difficulty bending her thumb and has pain when writing.     HPI Pt is here for a sick visit. She is here reporting that her right thumb has been sore for about a month.  It will intermittently improve.  She reads a lot and hold books in that hand.  She has tried to avoid using that hand for holding a book, and that has helped improve it greatly.  She is concerned because it feels like its locking up, and she has to physically snap it back into place.  She has intermittently taken one Advil.    Current Medication:  Outpatient Encounter Medications as of 09/19/2023  Medication Sig   CHOLECALCIFEROL PO Take 3,000 Units by mouth daily.   citalopram (CELEXA) 40 MG tablet Take 40 mg by mouth daily.   clonazePAM (KLONOPIN) 0.5 MG tablet    gabapentin (NEURONTIN) 300 MG capsule 300 mg 3 (three) times daily. BID-TID   meloxicam (MOBIC) 15 MG tablet Take 1 tablet (15 mg total) by mouth daily.   [DISCONTINUED] Multiple Vitamin (MULTIVITAMIN WITH MINERALS) TABS tablet Take 1 tablet by mouth daily.   No facility-administered encounter medications on file as of 09/19/2023.      Medical History: History reviewed. No pertinent past medical history.   Vital Signs: BP 122/74   Pulse 60   Temp (!) 96.2 F (35.7 C)   Ht 5' 3.5" (1.613 m)   Wt 155 lb (70.3 kg)   SpO2 98%   BMI 27.03 kg/m    Review of Systems  Constitutional:  Negative for chills, fatigue and fever.  HENT:  Negative for congestion.   Eyes:  Negative for pain and itching.  Gastrointestinal:  Negative for diarrhea, nausea and vomiting.    Physical Exam Vitals reviewed.  Constitutional:      Appearance: Normal  appearance.  HENT:     Head: Normocephalic.  Musculoskeletal:     Comments: Right thumb tenderness over PIP and MIP joints. No swelling, redness or warmth noted. Good ROM  Neurological:     Mental Status: She is alert.     Assessment/Plan: 1. Thumb pain, right (Primary) Patient will wait until upcoming procedure is done before starting meloxicam.  Then she will take it for 1-2 weeks and go to [facility] if no improvement.   General Counseling: chris narasimhan understanding of the findings of todays visit and agrees with plan of treatment. I have discussed any further diagnostic evaluation that may be needed or ordered today. We also reviewed her medications today. she has been encouraged to call the office with any questions or concerns that should arise related to todays visit.   No orders of the defined types were placed in this encounter.   Meds ordered this encounter  Medications   meloxicam (MOBIC) 15 MG tablet    Sig: Take 1 tablet (15 mg total) by mouth daily.    Dispense:  30 tablet    Refill:  0    Time spent:15 Minutes    Johnna Acosta AGNP-C Nurse Practitioner
